# Patient Record
Sex: Female | Born: 1950 | Race: White | Hispanic: No | Marital: Single | Smoking: Current every day smoker
Health system: Southern US, Community
[De-identification: ages and names within clinical notes are randomized; demographics above are authoritative.]

## PROBLEM LIST (undated history)

## (undated) DIAGNOSIS — E119 Type 2 diabetes mellitus without complications: Secondary | ICD-10-CM

## (undated) DIAGNOSIS — I10 Essential (primary) hypertension: Secondary | ICD-10-CM

---

## 2019-12-09 ENCOUNTER — Inpatient Hospital Stay: Payer: Medicare HMO | Admitting: Anesthesiology

## 2019-12-09 ENCOUNTER — Inpatient Hospital Stay: Payer: Medicare HMO

## 2019-12-09 ENCOUNTER — Encounter
Admission: EM | Disposition: A | Discharge status: Skilled Nursing Facility | Payer: Self-pay | Source: Home / Self Care | Attending: Internal Medicine

## 2019-12-09 ENCOUNTER — Emergency Department: Payer: Medicare HMO

## 2019-12-09 ENCOUNTER — Inpatient Hospital Stay
Admission: EM | Admit: 2019-12-09 | Discharge: 2019-12-14 | DRG: 481 | Disposition: A | Discharge status: Skilled Nursing Facility | Payer: Medicare HMO | Attending: Internal Medicine | Admitting: Internal Medicine

## 2019-12-09 ENCOUNTER — Other Ambulatory Visit: Payer: Self-pay

## 2019-12-09 DIAGNOSIS — W109XXA Fall (on) (from) unspecified stairs and steps, initial encounter: Secondary | ICD-10-CM | POA: Diagnosis present

## 2019-12-09 DIAGNOSIS — S72142K Displaced intertrochanteric fracture of left femur, subsequent encounter for closed fracture with nonunion: Secondary | ICD-10-CM

## 2019-12-09 DIAGNOSIS — S72142A Displaced intertrochanteric fracture of left femur, initial encounter for closed fracture: Secondary | ICD-10-CM | POA: Diagnosis present

## 2019-12-09 DIAGNOSIS — I10 Essential (primary) hypertension: Secondary | ICD-10-CM | POA: Diagnosis present

## 2019-12-09 DIAGNOSIS — F329 Major depressive disorder, single episode, unspecified: Secondary | ICD-10-CM | POA: Diagnosis present

## 2019-12-09 DIAGNOSIS — Z79890 Hormone replacement therapy (postmenopausal): Secondary | ICD-10-CM | POA: Diagnosis not present

## 2019-12-09 DIAGNOSIS — Z419 Encounter for procedure for purposes other than remedying health state, unspecified: Secondary | ICD-10-CM

## 2019-12-09 DIAGNOSIS — Z6829 Body mass index (BMI) 29.0-29.9, adult: Secondary | ICD-10-CM

## 2019-12-09 DIAGNOSIS — F17210 Nicotine dependence, cigarettes, uncomplicated: Secondary | ICD-10-CM | POA: Diagnosis present

## 2019-12-09 DIAGNOSIS — Z20822 Contact with and (suspected) exposure to covid-19: Secondary | ICD-10-CM | POA: Diagnosis present

## 2019-12-09 DIAGNOSIS — S82841A Displaced bimalleolar fracture of right lower leg, initial encounter for closed fracture: Secondary | ICD-10-CM | POA: Diagnosis present

## 2019-12-09 DIAGNOSIS — M25551 Pain in right hip: Secondary | ICD-10-CM | POA: Diagnosis present

## 2019-12-09 DIAGNOSIS — W19XXXS Unspecified fall, sequela: Secondary | ICD-10-CM | POA: Diagnosis not present

## 2019-12-09 DIAGNOSIS — Y92008 Other place in unspecified non-institutional (private) residence as the place of occurrence of the external cause: Secondary | ICD-10-CM

## 2019-12-09 DIAGNOSIS — E039 Hypothyroidism, unspecified: Secondary | ICD-10-CM | POA: Diagnosis present

## 2019-12-09 DIAGNOSIS — F17200 Nicotine dependence, unspecified, uncomplicated: Secondary | ICD-10-CM | POA: Diagnosis present

## 2019-12-09 DIAGNOSIS — E119 Type 2 diabetes mellitus without complications: Secondary | ICD-10-CM

## 2019-12-09 DIAGNOSIS — E669 Obesity, unspecified: Secondary | ICD-10-CM | POA: Diagnosis present

## 2019-12-09 DIAGNOSIS — S82891A Other fracture of right lower leg, initial encounter for closed fracture: Secondary | ICD-10-CM | POA: Diagnosis not present

## 2019-12-09 DIAGNOSIS — D62 Acute posthemorrhagic anemia: Secondary | ICD-10-CM | POA: Diagnosis not present

## 2019-12-09 DIAGNOSIS — T148XXA Other injury of unspecified body region, initial encounter: Secondary | ICD-10-CM

## 2019-12-09 DIAGNOSIS — Z7901 Long term (current) use of anticoagulants: Secondary | ICD-10-CM

## 2019-12-09 DIAGNOSIS — R52 Pain, unspecified: Secondary | ICD-10-CM

## 2019-12-09 DIAGNOSIS — W19XXXA Unspecified fall, initial encounter: Secondary | ICD-10-CM | POA: Diagnosis present

## 2019-12-09 DIAGNOSIS — E871 Hypo-osmolality and hyponatremia: Secondary | ICD-10-CM | POA: Diagnosis present

## 2019-12-09 HISTORY — DX: Essential (primary) hypertension: I10

## 2019-12-09 HISTORY — DX: Type 2 diabetes mellitus without complications: E11.9

## 2019-12-09 HISTORY — PX: INTRAMEDULLARY (IM) NAIL INTERTROCHANTERIC: SHX5875

## 2019-12-09 LAB — TYPE AND SCREEN
ABO/RH(D): O POS
Antibody Screen: NEGATIVE

## 2019-12-09 LAB — CBC WITH DIFFERENTIAL/PLATELET
Abs Immature Granulocytes: 0.03 10*3/uL (ref 0.00–0.07)
Basophils Absolute: 0 10*3/uL (ref 0.0–0.1)
Basophils Relative: 0 %
Eosinophils Absolute: 0.1 10*3/uL (ref 0.0–0.5)
Eosinophils Relative: 1 %
HCT: 41.5 % (ref 36.0–46.0)
Hemoglobin: 13.8 g/dL (ref 12.0–15.0)
Immature Granulocytes: 0 %
Lymphocytes Relative: 11 %
Lymphs Abs: 0.7 10*3/uL (ref 0.7–4.0)
MCH: 29.3 pg (ref 26.0–34.0)
MCHC: 33.3 g/dL (ref 30.0–36.0)
MCV: 88.1 fL (ref 80.0–100.0)
Monocytes Absolute: 0.3 10*3/uL (ref 0.1–1.0)
Monocytes Relative: 5 %
Neutro Abs: 5.6 10*3/uL (ref 1.7–7.7)
Neutrophils Relative %: 83 %
Platelets: 142 10*3/uL — ABNORMAL LOW (ref 150–400)
RBC: 4.71 MIL/uL (ref 3.87–5.11)
RDW: 14.6 % (ref 11.5–15.5)
WBC: 6.8 10*3/uL (ref 4.0–10.5)
nRBC: 0 % (ref 0.0–0.2)

## 2019-12-09 LAB — SARS CORONAVIRUS 2 BY RT PCR (HOSPITAL ORDER, PERFORMED IN ~~LOC~~ HOSPITAL LAB): SARS Coronavirus 2: NEGATIVE

## 2019-12-09 LAB — GLUCOSE, CAPILLARY
Glucose-Capillary: 168 mg/dL — ABNORMAL HIGH (ref 70–99)
Glucose-Capillary: 191 mg/dL — ABNORMAL HIGH (ref 70–99)

## 2019-12-09 LAB — BASIC METABOLIC PANEL
Anion gap: 9 (ref 5–15)
BUN: 7 mg/dL — ABNORMAL LOW (ref 8–23)
CO2: 23 mmol/L (ref 22–32)
Calcium: 9.5 mg/dL (ref 8.9–10.3)
Chloride: 100 mmol/L (ref 98–111)
Creatinine, Ser: 0.95 mg/dL (ref 0.44–1.00)
GFR calc Af Amer: >60 mL/min (ref >60)
GFR calc non Af Amer: >60 mL/min (ref >60)
Glucose, Bld: 169 mg/dL — ABNORMAL HIGH (ref 70–99)
Potassium: 4 mmol/L (ref 3.5–5.1)
Sodium: 132 mmol/L — ABNORMAL LOW (ref 135–145)

## 2019-12-09 LAB — TROPONIN I (HIGH SENSITIVITY): Troponin I (High Sensitivity): 4 ng/L (ref <18)

## 2019-12-09 SURGERY — FIXATION, FRACTURE, INTERTROCHANTERIC, WITH INTRAMEDULLARY ROD
Anesthesia: General | Site: Hip | Laterality: Left

## 2019-12-09 MED ORDER — PROPOFOL 500 MG/50ML IV EMUL
INTRAVENOUS | Status: AC
  Filled 2019-12-09: qty 50

## 2019-12-09 MED ORDER — DOCUSATE SODIUM 100 MG PO CAPS
100.0000 mg | ORAL_CAPSULE | Freq: Two times a day (BID) | ORAL | Status: DC
  Administered 2019-12-10 – 2019-12-14 (×8): 100 mg via ORAL
  Filled 2019-12-09 (×9): qty 1

## 2019-12-09 MED ORDER — OXYCODONE HCL 5 MG PO TABS
5.0000 mg | ORAL_TABLET | ORAL | Status: DC | PRN
  Administered 2019-12-09: 5 mg via ORAL
  Administered 2019-12-10: 10 mg via ORAL
  Administered 2019-12-10: 5 mg via ORAL
  Administered 2019-12-10 – 2019-12-14 (×10): 10 mg via ORAL
  Filled 2019-12-09: qty 1
  Filled 2019-12-09 (×2): qty 2
  Filled 2019-12-09: qty 1
  Filled 2019-12-09 (×10): qty 2

## 2019-12-09 MED ORDER — FENTANYL CITRATE (PF) 100 MCG/2ML IJ SOLN
25.0000 ug | INTRAMUSCULAR | Status: DC | PRN
  Administered 2019-12-09 (×4): 25 ug via INTRAVENOUS

## 2019-12-09 MED ORDER — CEFAZOLIN SODIUM-DEXTROSE 2-4 GM/100ML-% IV SOLN
2.0000 g | Freq: Once | INTRAVENOUS | Status: AC
  Administered 2019-12-09: 2 g via INTRAVENOUS
  Filled 2019-12-09: qty 100

## 2019-12-09 MED ORDER — HYDRALAZINE HCL 20 MG/ML IJ SOLN
5.0000 mg | Freq: Once | INTRAMUSCULAR | Status: AC
  Administered 2019-12-09: 5 mg via INTRAVENOUS

## 2019-12-09 MED ORDER — ONDANSETRON HCL 4 MG/2ML IJ SOLN
4.0000 mg | Freq: Once | INTRAMUSCULAR | Status: AC
  Administered 2019-12-09: 4 mg via INTRAVENOUS
  Filled 2019-12-09: qty 2

## 2019-12-09 MED ORDER — KETOROLAC TROMETHAMINE 15 MG/ML IJ SOLN
7.5000 mg | Freq: Four times a day (QID) | INTRAMUSCULAR | Status: AC
  Administered 2019-12-09 – 2019-12-10 (×4): 7.5 mg via INTRAVENOUS
  Filled 2019-12-09 (×3): qty 1

## 2019-12-09 MED ORDER — SODIUM CHLORIDE 0.45 % IV SOLN: INTRAVENOUS | Status: DC

## 2019-12-09 MED ORDER — HYDROCODONE-ACETAMINOPHEN 5-325 MG PO TABS
1.0000 | ORAL_TABLET | Freq: Four times a day (QID) | ORAL | Status: DC | PRN
  Administered 2019-12-09: 2 via ORAL
  Filled 2019-12-09: qty 2

## 2019-12-09 MED ORDER — HYDROMORPHONE HCL 1 MG/ML IJ SOLN
INTRAMUSCULAR | Status: AC
  Filled 2019-12-09: qty 1

## 2019-12-09 MED ORDER — MIDAZOLAM HCL 2 MG/2ML IJ SOLN
INTRAMUSCULAR | Status: AC
  Filled 2019-12-09: qty 2

## 2019-12-09 MED ORDER — FENTANYL CITRATE (PF) 100 MCG/2ML IJ SOLN
INTRAMUSCULAR | Status: AC
  Filled 2019-12-09: qty 2

## 2019-12-09 MED ORDER — MAGNESIUM HYDROXIDE 400 MG/5ML PO SUSP: 30.0000 mL | Freq: Every day | ORAL | Status: DC | PRN

## 2019-12-09 MED ORDER — FLEET ENEMA 7-19 GM/118ML RE ENEM: 1.0000 | ENEMA | Freq: Once | RECTAL | Status: DC | PRN

## 2019-12-09 MED ORDER — KETAMINE HCL 50 MG/ML IJ SOLN
INTRAMUSCULAR | Status: DC | PRN
  Administered 2019-12-09: 50 mg via INTRAVENOUS
  Administered 2019-12-09: 25 mg via INTRAVENOUS

## 2019-12-09 MED ORDER — ACETAMINOPHEN 500 MG PO TABS
1000.0000 mg | ORAL_TABLET | Freq: Four times a day (QID) | ORAL | Status: AC
  Administered 2019-12-09 – 2019-12-10 (×4): 1000 mg via ORAL
  Filled 2019-12-09 (×4): qty 2

## 2019-12-09 MED ORDER — TRAMADOL HCL 50 MG PO TABS
50.0000 mg | ORAL_TABLET | Freq: Four times a day (QID) | ORAL | Status: DC | PRN
  Administered 2019-12-11 – 2019-12-12 (×2): 50 mg via ORAL
  Filled 2019-12-09 (×2): qty 1

## 2019-12-09 MED ORDER — PROPOFOL 500 MG/50ML IV EMUL
INTRAVENOUS | Status: DC | PRN
  Administered 2019-12-09: 100 ug/kg/min via INTRAVENOUS

## 2019-12-09 MED ORDER — METOCLOPRAMIDE HCL 10 MG PO TABS
5.0000 mg | ORAL_TABLET | Freq: Three times a day (TID) | ORAL | Status: DC | PRN
  Administered 2019-12-10: 10 mg via ORAL
  Filled 2019-12-09: qty 1

## 2019-12-09 MED ORDER — METHOCARBAMOL 1000 MG/10ML IJ SOLN
500.0000 mg | Freq: Four times a day (QID) | INTRAVENOUS | Status: DC | PRN
  Filled 2019-12-09: qty 5

## 2019-12-09 MED ORDER — DIPHENHYDRAMINE HCL 12.5 MG/5ML PO ELIX: 12.5000 mg | ORAL_SOLUTION | ORAL | Status: DC | PRN

## 2019-12-09 MED ORDER — KETOROLAC TROMETHAMINE 15 MG/ML IJ SOLN
INTRAMUSCULAR | Status: AC
  Filled 2019-12-09: qty 1

## 2019-12-09 MED ORDER — ONDANSETRON HCL 4 MG/2ML IJ SOLN: 4.0000 mg | Freq: Once | INTRAMUSCULAR | Status: DC | PRN

## 2019-12-09 MED ORDER — MORPHINE SULFATE (PF) 2 MG/ML IV SOLN: 2.0000 mg | INTRAVENOUS | Status: DC | PRN

## 2019-12-09 MED ORDER — SODIUM CHLORIDE 0.9 % IV SOLN: INTRAVENOUS | Status: DC | PRN

## 2019-12-09 MED ORDER — HYDROMORPHONE HCL 1 MG/ML IJ SOLN
INTRAMUSCULAR | Status: DC | PRN
  Administered 2019-12-09 (×4): .5 mg via INTRAVENOUS

## 2019-12-09 MED ORDER — HYDRALAZINE HCL 20 MG/ML IJ SOLN
10.0000 mg | Freq: Four times a day (QID) | INTRAMUSCULAR | Status: DC | PRN
  Administered 2019-12-10 – 2019-12-12 (×2): 10 mg via INTRAVENOUS
  Filled 2019-12-09 (×2): qty 1

## 2019-12-09 MED ORDER — ACETAMINOPHEN 325 MG PO TABS
325.0000 mg | ORAL_TABLET | Freq: Four times a day (QID) | ORAL | Status: DC | PRN
  Administered 2019-12-12: 650 mg via ORAL
  Filled 2019-12-09: qty 2

## 2019-12-09 MED ORDER — MIDAZOLAM HCL 2 MG/2ML IJ SOLN
INTRAMUSCULAR | Status: DC | PRN
  Administered 2019-12-09: 2 mg via INTRAVENOUS

## 2019-12-09 MED ORDER — MORPHINE SULFATE (PF) 4 MG/ML IV SOLN
4.0000 mg | INTRAVENOUS | Status: DC | PRN
  Administered 2019-12-09: 4 mg via INTRAVENOUS
  Filled 2019-12-09: qty 1

## 2019-12-09 MED ORDER — ONDANSETRON HCL 4 MG/2ML IJ SOLN
4.0000 mg | Freq: Four times a day (QID) | INTRAMUSCULAR | Status: DC | PRN
  Administered 2019-12-12: 4 mg via INTRAVENOUS
  Filled 2019-12-09: qty 2

## 2019-12-09 MED ORDER — METOCLOPRAMIDE HCL 5 MG/ML IJ SOLN: 5.0000 mg | Freq: Three times a day (TID) | INTRAMUSCULAR | Status: DC | PRN

## 2019-12-09 MED ORDER — BISACODYL 10 MG RE SUPP: 10.0000 mg | Freq: Every day | RECTAL | Status: DC | PRN

## 2019-12-09 MED ORDER — METHOCARBAMOL 500 MG PO TABS
500.0000 mg | ORAL_TABLET | Freq: Four times a day (QID) | ORAL | Status: DC | PRN
  Administered 2019-12-10 – 2019-12-12 (×3): 500 mg via ORAL
  Filled 2019-12-09 (×4): qty 1

## 2019-12-09 MED ORDER — RIVAROXABAN 10 MG PO TABS
10.0000 mg | ORAL_TABLET | Freq: Every day | ORAL | Status: DC
  Filled 2019-12-09: qty 1

## 2019-12-09 MED ORDER — FENTANYL CITRATE (PF) 100 MCG/2ML IJ SOLN
INTRAMUSCULAR | Status: DC | PRN
  Administered 2019-12-09 (×4): 25 ug via INTRAVENOUS

## 2019-12-09 MED ORDER — CEFAZOLIN SODIUM-DEXTROSE 2-4 GM/100ML-% IV SOLN
2.0000 g | Freq: Four times a day (QID) | INTRAVENOUS | Status: AC
  Administered 2019-12-10 (×3): 2 g via INTRAVENOUS
  Filled 2019-12-09 (×5): qty 100

## 2019-12-09 MED ORDER — HYDRALAZINE HCL 20 MG/ML IJ SOLN
INTRAMUSCULAR | Status: AC
  Filled 2019-12-09: qty 1

## 2019-12-09 MED ORDER — BUPIVACAINE-EPINEPHRINE (PF) 0.5% -1:200000 IJ SOLN
INTRAMUSCULAR | Status: DC | PRN
  Administered 2019-12-09: 30 mL via PERINEURAL

## 2019-12-09 MED ORDER — PROPOFOL 10 MG/ML IV BOLUS
INTRAVENOUS | Status: AC
  Filled 2019-12-09: qty 20

## 2019-12-09 MED ORDER — POLYETHYLENE GLYCOL 3350 17 G PO PACK: 17.0000 g | PACK | Freq: Every day | ORAL | Status: DC | PRN

## 2019-12-09 MED ORDER — KETAMINE HCL 50 MG/ML IJ SOLN
INTRAMUSCULAR | Status: AC
  Filled 2019-12-09: qty 10

## 2019-12-09 MED ORDER — LACTATED RINGERS IV SOLN: INTRAVENOUS | Status: DC | PRN

## 2019-12-09 MED ORDER — HYDROMORPHONE HCL 1 MG/ML IJ SOLN
0.5000 mg | INTRAMUSCULAR | Status: AC | PRN
  Administered 2019-12-09 (×4): 0.5 mg via INTRAVENOUS

## 2019-12-09 MED ORDER — ONDANSETRON HCL 4 MG PO TABS
4.0000 mg | ORAL_TABLET | Freq: Four times a day (QID) | ORAL | Status: DC | PRN
  Administered 2019-12-10: 4 mg via ORAL
  Filled 2019-12-09: qty 1

## 2019-12-09 MED ORDER — MORPHINE SULFATE (PF) 2 MG/ML IV SOLN
2.0000 mg | INTRAVENOUS | Status: DC | PRN
  Administered 2019-12-10 – 2019-12-12 (×3): 2 mg via INTRAVENOUS
  Filled 2019-12-09 (×3): qty 1

## 2019-12-09 MED ORDER — INSULIN ASPART 100 UNIT/ML ~~LOC~~ SOLN
0.0000 [IU] | SUBCUTANEOUS | Status: DC
  Administered 2019-12-09: 3 [IU] via SUBCUTANEOUS
  Administered 2019-12-10: 2 [IU] via SUBCUTANEOUS
  Administered 2019-12-10 – 2019-12-11 (×10): 3 [IU] via SUBCUTANEOUS
  Administered 2019-12-11: 8 [IU] via SUBCUTANEOUS
  Administered 2019-12-11 – 2019-12-12 (×2): 3 [IU] via SUBCUTANEOUS
  Administered 2019-12-12: 2 [IU] via SUBCUTANEOUS
  Administered 2019-12-12 (×2): 3 [IU] via SUBCUTANEOUS
  Administered 2019-12-12: 5 [IU] via SUBCUTANEOUS
  Administered 2019-12-12 – 2019-12-13 (×4): 3 [IU] via SUBCUTANEOUS
  Administered 2019-12-14: 2 [IU] via SUBCUTANEOUS
  Administered 2019-12-14: 5 [IU] via SUBCUTANEOUS
  Administered 2019-12-14: 3 [IU] via SUBCUTANEOUS
  Administered 2019-12-14: 2 [IU] via SUBCUTANEOUS
  Filled 2019-12-09 (×27): qty 1

## 2019-12-09 SURGICAL SUPPLY — 46 items
BIT DRILL 4.3MMS DISTAL GRDTED (BIT) ×1 IMPLANT
BNDG COHESIVE 4X5 TAN STRL (GAUZE/BANDAGES/DRESSINGS) ×3 IMPLANT
BNDG COHESIVE 6X5 TAN STRL LF (GAUZE/BANDAGES/DRESSINGS) ×3 IMPLANT
CANISTER SUCT 1200ML W/VALVE (MISCELLANEOUS) ×3 IMPLANT
CHLORAPREP W/TINT 26 (MISCELLANEOUS) ×6 IMPLANT
CORTICAL BONE SCR 5.0MM X 48MM (Screw) ×3 IMPLANT
COVER WAND RF STERILE (DRAPES) ×3 IMPLANT
DRAPE 3/4 80X56 (DRAPES) ×3 IMPLANT
DRAPE C-ARMOR (DRAPES) ×3 IMPLANT
DRILL 4.3MMS DISTAL GRADUATED (BIT) ×3
DRSG OPSITE POSTOP 3X4 (GAUZE/BANDAGES/DRESSINGS) ×3 IMPLANT
DRSG OPSITE POSTOP 4X6 (GAUZE/BANDAGES/DRESSINGS) ×6 IMPLANT
ELECT CAUTERY BLADE 6.4 (BLADE) ×3 IMPLANT
ELECT REM PT RETURN 9FT ADLT (ELECTROSURGICAL) ×3
ELECTRODE REM PT RTRN 9FT ADLT (ELECTROSURGICAL) ×1 IMPLANT
GAUZE SPONGE 4X4 12PLY STRL (GAUZE/BANDAGES/DRESSINGS) ×3 IMPLANT
GLOVE BIO SURGEON STRL SZ8 (GLOVE) ×6 IMPLANT
GLOVE INDICATOR 8.0 STRL GRN (GLOVE) ×3 IMPLANT
GOWN STRL REUS W/ TWL LRG LVL3 (GOWN DISPOSABLE) ×1 IMPLANT
GOWN STRL REUS W/ TWL XL LVL3 (GOWN DISPOSABLE) ×1 IMPLANT
GOWN STRL REUS W/TWL LRG LVL3 (GOWN DISPOSABLE) ×2
GOWN STRL REUS W/TWL XL LVL3 (GOWN DISPOSABLE) ×2
GUIDEPIN 3.2X17.5 THRD DISP (PIN) ×3 IMPLANT
GUIDEWIRE BALL NOSE 100CM (WIRE) ×3 IMPLANT
HIP FRAC NAIL LAG SCR 10.5X100 (Orthopedic Implant) ×2 IMPLANT
MAT ABSORB  FLUID 56X50 GRAY (MISCELLANEOUS) ×2
MAT ABSORB FLUID 56X50 GRAY (MISCELLANEOUS) ×1 IMPLANT
NAIL HIP FRACT LT 130D 11X400 (Nail) ×3 IMPLANT
NEEDLE FILTER BLUNT 18X 1/2SAF (NEEDLE) ×2
NEEDLE FILTER BLUNT 18X1 1/2 (NEEDLE) ×1 IMPLANT
NEEDLE HYPO 22GX1.5 SAFETY (NEEDLE) ×3 IMPLANT
NS IRRIG 500ML POUR BTL (IV SOLUTION) ×3 IMPLANT
PACK HIP COMPR (MISCELLANEOUS) ×3 IMPLANT
PISTON OSS BIG EASY 5X4.5MM (MISCELLANEOUS) IMPLANT
SCREW BONE CORTICAL 5.0X44 (Screw) ×3 IMPLANT
SCREW CANN THRD AFF 10.5X100 (Orthopedic Implant) ×1 IMPLANT
SCREW CORTICL BON 5.0MM X 48MM (Screw) ×1 IMPLANT
STAPLER SKIN PROX 35W (STAPLE) ×3 IMPLANT
STRAP SAFETY 5IN WIDE (MISCELLANEOUS) ×3 IMPLANT
SUT VIC AB 0 CT1 36 (SUTURE) ×3 IMPLANT
SUT VIC AB 1 CT1 36 (SUTURE) ×3 IMPLANT
SUT VIC AB 2-0 CT1 (SUTURE) ×6 IMPLANT
SYR 10ML LL (SYRINGE) ×3 IMPLANT
SYR 30ML LL (SYRINGE) ×3 IMPLANT
TAPE MICROFOAM 4IN (TAPE) ×3 IMPLANT
TRAY FOLEY SLVR 16FR LF STAT (SET/KITS/TRAYS/PACK) IMPLANT

## 2019-12-09 NOTE — Anesthesia Preprocedure Evaluation (Addendum)
Anesthesia Evaluation  Patient identified by MRN, date of birth, ID band Patient awake  Preop documentation limited or incomplete due to emergent nature of procedure.  Airway Mallampati: II   Neck ROM: full    Dental  (+) Poor Dentition   Pulmonary neg pulmonary ROS, Current Smoker,    Pulmonary exam normal        Cardiovascular Exercise Tolerance: Poor hypertension, On Medications Normal cardiovascular exam+ dysrhythmias Atrial Fibrillation  Rhythm:regular Rate:Normal  Sp cardiac oblation. ja   Neuro/Psych negative neurological ROS  negative psych ROS   GI/Hepatic negative GI ROS, Neg liver ROS,   Endo/Other  negative endocrine ROSdiabetes, Well Controlled, Type 1, Insulin Dependent  Renal/GU negative Renal ROS  negative genitourinary   Musculoskeletal   Abdominal   Peds  Hematology negative hematology ROS (+)   Anesthesia Other Findings Past Medical History: No date: Diabetes mellitus without complication (HCC) No date: Hypertension History reviewed. No pertinent surgical history.   Reproductive/Obstetrics negative OB ROS                            Anesthesia Physical Anesthesia Plan  ASA: II and emergent  Anesthesia Plan: Spinal   Post-op Pain Management:    Induction:   PONV Risk Score and Plan: 2  Airway Management Planned:   Additional Equipment:   Intra-op Plan:   Post-operative Plan:   Informed Consent: I have reviewed the patients History and Physical, chart, labs and discussed the procedure including the risks, benefits and alternatives for the proposed anesthesia with the patient or authorized representative who has indicated his/her understanding and acceptance.     Dental Advisory Given  Plan Discussed with: CRNA  Anesthesia Plan Comments:         Anesthesia Quick Evaluation

## 2019-12-09 NOTE — ED Provider Notes (Signed)
[facility] Emergency Department Provider Note     First MD Initiated Contact with Patient 12/09/19 [phone]     (approximate)  I have reviewed the triage vital signs and the nursing notes.   HISTORY  Chief Complaint Hip Pain and Fall    HPI Melanie Hammond is a 69 y.o. female with a stated past medical history of hypertension, diabetes, and mitral valve prolapse who presents after a mechanical fall via EMS complaining of left hip pain.  Patient's left hip is shortened and internally rotated.  Patient states that she was walking up some steps and tried to step back onto her lower step and lost her footing falling onto her left side.  Patient denies any head trauma or loss of consciousness.  Patient denies any numbness/paresthesias distally         Past Medical History:  Diagnosis Date  . Diabetes mellitus without complication (HCC)   . Hypertension     Patient Active Problem List   Diagnosis Date Noted  . Intertrochanteric fracture of left hip (HCC) 12/09/2019  . Diabetes mellitus (HCC) 12/09/2019  . Nicotine dependence 12/09/2019  . Essential hypertension 12/09/2019  . Obesity (BMI 30-39.9) 12/09/2019  . Fall 12/09/2019    History reviewed. No pertinent surgical history.  Prior to Admission medications   Not on File    Allergies Compazine [prochlorperazine] and Thorazine [chlorpromazine]  Family History  Problem Relation Age of Onset  . Hypertension Mother   . Hypertension Father     Social History Social History   Tobacco Use  . Smoking status: Current Every Day Smoker    Packs/day: 0.50    Years: 10.00    Pack years: 5.00  . Smokeless tobacco: Never Used  Substance Use Topics  . Alcohol use: Not on file  . Drug use: Never    Review of Systems Constitutional: No fever/chills Eyes: No visual changes. ENT: No sore throat. Cardiovascular: Denies chest pain. Respiratory: Denies shortness of breath. Gastrointestinal: No  abdominal pain.  No nausea, no vomiting.  No diarrhea. Genitourinary: Negative for dysuria. Musculoskeletal: Endorses left hip pain Skin: Negative for rash. Neurological: Negative for headaches, weakness/numbness/paresthesias in any extremity Psychiatric: Negative for suicidal ideation/homicidal ideation   ____________________________________________   PHYSICAL EXAM:  VITAL SIGNS: ED Triage Vitals  Enc Vitals Group     BP 12/09/19 0934 (!) 191/85     Pulse Rate 12/09/19 0934 66     Resp 12/09/19 0934 14     Temp 12/09/19 0934 98.7 F (37.1 C)     Temp src --      SpO2 12/09/19 0934 95 %     Weight 12/09/19 0935 192 lb (87.1 kg)     Height --      Head Circumference --      Peak Flow --      Pain Score 12/09/19 0934 8     Pain Loc --      Pain Edu? --      Excl. in GC? --    Constitutional: Alert and oriented. Well appearing and in no acute distress. Eyes: Conjunctivae are normal. PERRL. EOMI. Head: Atraumatic. Nose: No congestion/rhinnorhea. Mouth/Throat: Mucous membranes are moist. Neck: No stridor Cardiovascular: Normal rate, regular rhythm. Grossly normal heart sounds.  Good peripheral circulation. Respiratory: Normal respiratory effort.  No retractions. Gastrointestinal: Soft and nontender. No distention. Musculoskeletal: Significant tenderness palpation over left lateral and anterior hip and exquisite pain with any range of motion..  No joint effusions.  Neurologic: Distally neurovascularly intact.  Normal speech and language. No gross focal neurologic deficits are appreciated. Skin:  Skin is warm and dry. No rash noted. Psychiatric: Mood and affect are normal. Speech and behavior are normal.  ____________________________________________   LABS (all labs ordered are listed, but only abnormal results are displayed)  Labs Reviewed  BASIC METABOLIC PANEL - Abnormal; Notable for the following components:      Result Value   Sodium 132 (*)    Glucose, Bld 169 (*)     BUN 7 (*)    All other components within normal limits  CBC WITH DIFFERENTIAL/PLATELET - Abnormal; Notable for the following components:   Platelets 142 (*)    All other components within normal limits  SARS CORONAVIRUS 2 BY RT PCR (HOSPITAL ORDER, PERFORMED IN [facility])  HIV ANTIBODY (ROUTINE TESTING W REFLEX)  HEMOGLOBIN A1C  TYPE AND SCREEN  TROPONIN I (HIGH SENSITIVITY)  TROPONIN I (HIGH SENSITIVITY)   ____________________________________________ ____________________________________________  RADIOLOGY  ED MD interpretation: Three-view x-ray of the left hip shows a comminuted proximal femur fracture with shortening.  Radiology read pending  Official radiology report(s): DG Chest Port 1 View  Result Date: 12/09/2019 CLINICAL DATA:  Fall today with left hip pain. EXAM: PORTABLE CHEST 1 VIEW COMPARISON:  None. FINDINGS: Lordotic technique is demonstrated. Lungs are adequately inflated and otherwise clear. Cardiomediastinal silhouette is normal. Mild diffuse decreased bone mineralization. Surgical clips over the right axilla. IMPRESSION: No acute cardiopulmonary disease. Electronically Signed   By: Elberta Fortis M.D.   On: 12/09/2019 11:00   DG Hip Unilat With Pelvis 2-3 Views Left  Result Date: 12/09/2019 CLINICAL DATA:  Fall today with left hip pain. EXAM: DG HIP (WITH OR WITHOUT PELVIS) 2-3V LEFT COMPARISON:  None. FINDINGS: Exam demonstrates a moderately displaced intertrochanteric fracture of the left hip. Minimal symmetric degenerative changes of the hips. Mild degenerate change of the spine. Mild diffuse osteopenia. IMPRESSION: Moderately displaced intertrochanteric fracture of the left hip. Electronically Signed   By: Elberta Fortis M.D.   On: 12/09/2019 10:59    ____________________________________________   PROCEDURES  Procedure(s) performed (including Critical Care):  Procedures   ____________________________________________   INITIAL IMPRESSION  / ASSESSMENT AND PLAN / ED COURSE        Patient is a 69 year old female that presents for left hip pain Workup: XR hip Findings: Left proximal femur fracture without dislocation Consult: Orthopedic Surgery (query fascia iliacus block vs continued opiate pain control), hospitalist  Patient does not currently demonstrate complications of fracture such as compartment syndrome, arterial or nerve injury.  Interventions: analgesia Disposition: Admit      ____________________________________________   FINAL CLINICAL IMPRESSION(S) / ED DIAGNOSES  Final diagnoses:  Closed displaced intertrochanteric fracture of left femur, initial encounter [facility])     ED Discharge Orders    None       Note:  This document was prepared using Dragon voice recognition software and may include unintentional dictation errors.   Merwyn Katos, MD 12/09/19 1539

## 2019-12-09 NOTE — Consult Note (Addendum)
ANTICOAGULATION CONSULT NOTE  Pharmacy Consult for Xarelto Indication: VTE prophylaxis  Allergies  Allergen Reactions  . Compazine [Prochlorperazine]   . Thorazine [Chlorpromazine]     Patient Measurements: Weight: 87.1 kg (192 lb)   Vital Signs: Temp: 98.6 F (37 C) (09/18 1744) Temp Source: Oral (09/18 1744) BP: 130/63 (09/18 1744) Pulse Rate: 85 (09/18 1744)  Labs: Recent Labs    12/09/19 1115  HGB 13.8  HCT 41.5  PLT 142*  CREATININE 0.95  TROPONINIHS 4    CrCl cannot be calculated (Unknown ideal weight.).   Medications:  Xarelto - med rec incomplete and pharmacy is unable to reach the patient at this time  Assessment: Pharmacy has consulted for DVT ppx in a patient with a PMH significant for mitral valve prolapse, HTN, and diabetes. Patient is admitted for Intertrochanteric fracture of left hip. Per chart review, patient was previously on Xarelto, but pharmacy is unable to confirm dose (with multiple attempts to reach the patient). Per consult from Dr. Joice Lofts, consult for DVT ppx and to resume Xarelto tomorrow morning - dose unknown to him as well.   Plan:   D/w Dr. Joice Lofts to start Xarelto tomorrow and will do Xarelto 10 mg daily for now until home dose is updated. Will follow Scr/CBC every 3 days.   Pharmacy will continue to obtain updated Xarelto dose.   Katha Cabal 12/09/2019,5:53 PM

## 2019-12-09 NOTE — Anesthesia Postprocedure Evaluation (Signed)
Anesthesia Post Note  Patient: Melanie Hammond  Procedure(s) Performed: INTRAMEDULLARY (IM) NAIL INTERTROCHANTRIC (Left Hip)  Patient location during evaluation: PACU Anesthesia Type: General Level of consciousness: awake and alert Pain management: pain level controlled (Pt w chronic pain, 6/10, currently dozing off) Vital Signs Assessment: post-procedure vital signs reviewed and stable Respiratory status: spontaneous breathing, nonlabored ventilation and respiratory function stable Cardiovascular status: blood pressure returned to baseline and stable Postop Assessment: no apparent nausea or vomiting Anesthetic complications: no   No complications documented.   Last Vitals:  Vitals:   12/09/19 1744 12/09/19 1817  BP: 130/63 (!) 148/54  Pulse: 85 79  Resp: 18 17  Temp: 37 C 37 C  SpO2: 96% 95%    Last Pain:  Vitals:   12/09/19 1748  TempSrc:   PainSc: 8                  Vonceil Upshur Garry Heater

## 2019-12-09 NOTE — ED Triage Notes (Signed)
Pt presents via EMS s/p mechanical fall. C/o left hip pain. Denies LOC.

## 2019-12-09 NOTE — Op Note (Signed)
12/09/2019  3:47 PM  Patient:   Melanie Hammond  Pre-Op Diagnosis:   Closed displaced comminuted intertrochanteric fracture, left hip.  Post-Op Diagnosis:   Same  Procedure:   Reduction and internal fixation of displaced comminuted intertrochanteric left hip fracture with Biomet Affixis TFN nail.  Surgeon:   Maryagnes Amos, MD  Assistant:   None  Anesthesia:   General anesthesia  Findings:   As above  Complications:   None  EBL:   100 cc  Fluids:   1200 cc crystalloid  UOP:   None  TT:   None  Drains:   None  Closure:   Staples  Implants:   Biomet Affixis 11 x 400 mm TFN with a 100 mm lag screw and 44 mm and 48 mm distal interlocking screws  Brief Clinical Note:   The patient is a 69 year old female who sustained above-noted injury earlier today when she tripped and fell on some steps, injuring her left hip. She was brought to the emergency room where x-rays demonstrated the above-noted injury. The patient has been cleared medically and presents at this time for reduction and internal fixation of the displaced intertrochanteric left hip fracture.  Procedure:   The patient was brought into the operating room. After adequate general anesthesia was obtained, the patient was lain in the supine position on the fracture table. The uninjured leg was placed in a flexed and abducted position while the injured lower extremity was placed in longitudinal traction. The fracture was reduced using longitudinal traction and internal rotation. The adequacy of reduction was verified fluoroscopically in AP and lateral projections and found to be satisfactory. The lateral aspects of the left hip and thigh were prepped with ChloraPrep solution before being draped sterilely. Preoperative antibiotics were administered. A timeout was performed to verify the appropriate surgical site.   The greater trochanter was identified fluoroscopically and an approximately 3 cm incision made about 2-3 fingerbreadths  above the tip of the greater trochanter. The incision was carried down through the subcutaneous tissues to expose the gluteal fascia. This was split the length of the incision, providing access to the tip of the trochanter. Under fluoroscopic guidance, a guidewire was drilled through the tip of the trochanter into the proximal metaphysis to the level of the lesser trochanter. After verifying its position fluoroscopically in AP and lateral projections, it was overreamed with the initial reamer to the depth of the lesser trochanter. A guidewire was passed down through the femoral canal to the supracondylar region. The adequacy of guidewire position was verified fluoroscopically in AP and lateral projections before the length of the guidewire within the canal was measured and found to be 430 mm. Therefore, a 400 mm length nail was selected. The guidewire was overreamed sequentially using the flexible reamers, beginning with a 10.5 mm reamer and progressing to a 12.5 mm reamer. This provided good cortical chatter. The 11 x 400 mm Biomet Affixis TFN rod was selected and advanced to the appropriate depth, as verified fluoroscopically.   The guide system for the lag screw was positioned and advanced through an approximately 2 cm stab incision over the lateral aspect of the proximal femur. The guidewire was drilled up through the trochanteric femoral nail and into the femoral neck to rest within 5 mm of subchondral bone. After verifying its position in the femoral neck and head in both AP and lateral projections, the guidewire was measured and found to be optimally replicated by a 100 mm lag screw. The guidewire  was overreamed to the appropriate depth before the lag screw was inserted and advanced to the appropriate depth as verified fluoroscopically in AP and lateral projections. The locking screw was advanced, then backed off a quarter turn to set the lag screw. Again the adequacy of hardware position and fracture  reduction was verified fluoroscopically in AP and lateral projections and found to be excellent.  Attention was directed distally. Using the "perfect circle" technique, the leg and fluoroscopy machine were positioned appropriately. An approximately 1.5 cm stab incision was made over the skin at the appropriate point before the drill bit was advanced through the cortex and across the static hole of the nail. The appropriate length of the screw was determined before the 44 mm distal interlocking screw was positioned, then advanced and tightened securely. Given that the fracture had a subtrochanteric extension, it was felt best to place a second distal interlocking screw. Again using the "perfect circle" technique, a second 1.5 cm stab incision was made over the slotted hole. The drill bit was advanced through the cortex and across the nail to penetrate the medial cortex. The appropriate length of screw was determined before the 48 mm distal interlocking screw was positioned, then advanced and tightened securely. Again the adequacy of screw position was verified fluoroscopically in AP and lateral projections and found to be excellent.  The wounds were irrigated thoroughly with sterile saline solution before the abductor fascia was reapproximated using #1 Vicryl interrupted sutures. The subcutaneous tissues were closed using 2-0 Vicryl interrupted sutures. The skin was closed using staples. A total of 30 cc of 0.5% Sensorcaine with epinephrine was injected in and around all incisions. Sterile occlusive dressings were applied to all wounds before the patient was awakened and transferred back to her hospital bed. The patient was then transferred to the recovery room in satisfactory condition after tolerating the procedure well.

## 2019-12-09 NOTE — H&P (Signed)
History and Physical    Melanie Hammond ZOX:[id number] DOB: Oct 13, 1950 DOA: 12/09/2019  PCP: Melanie Hammond, No Pcp Per   Melanie Hammond coming from: Home  I have personally briefly reviewed Melanie Hammond's old medical records in [facility]  Chief Complaint: Left hip pain   HPI: Melanie Hammond is a 69 y.o. female with medical history significant for diabetes mellitus, hypertension, nicotine dependence and history of mitral valve prolapse who was brought into the ER by EMS after she fell landing on her left side.  Melanie Hammond was unable to get up after her fall due to severe pain.  Melanie Hammond stated that she fell while walking up some stairs landing on her left side.  Left leg appears to be shortened and internally rotated.  She denies any head trauma loss of consciousness.  She denies feeling dizzy or lightheaded prior to the fall. She complains of shortness of breath but denies having any chest pain, no nausea, no vomiting, no diaphoresis or palpitations. She denies having any abdominal pain or any changes in her bowel habits. Labs show sodium of 132, potassium of 4, chloride 100, bicarb 23, BUN 7, creatinine 0.95, white count 6.8, hemoglobin 13.8, hematocrit 41.5, MCV 88.1, RDW 14.6, platelet count 142 Chest x-ray reviewed by me shows no obvious infiltrate or effusion X-ray of the left hip shows moderately displaced intertrochanteric fracture of the left hip Twelve-lead EKG reviewed by me shows sinus rhythm with ST depression in the lateral leads    ED Course: Melanie Hammond is a 69 year old female with a history of diabetes mellitus, hypertension, nicotine dependence and history of mitral valve prolapse who presents to the ER after a mechanical fall at home.  She has a left intertrochanteric femur fracture will be admitted to the hospital for further evaluation.  Review of Systems: As per HPI otherwise 10 point review of systems negative.   Past Medical History:  Diagnosis Date   Diabetes mellitus without complication  (HCC)    Hypertension     History reviewed. No pertinent surgical history.   reports that she has been smoking. She has a 5.00 pack-year smoking history. She has never used smokeless tobacco. She reports that she does not use drugs. No history on file for alcohol use.  Allergies  Allergen Reactions   Compazine [Prochlorperazine]    Thorazine [Chlorpromazine]     Family History  Problem Relation Age of Onset   Hypertension Mother    Hypertension Father      Prior to Admission medications   Not on File    Physical Exam: Vitals:   12/09/19 0934 12/09/19 0935  BP: (!) 191/85   Pulse: 66   Resp: 14   Temp: 98.7 F (37.1 C)   SpO2: 95%   Weight:  87.1 kg     Vitals:   12/09/19 0934 12/09/19 0935  BP: (!) 191/85   Pulse: 66   Resp: 14   Temp: 98.7 F (37.1 C)   SpO2: 95%   Weight:  87.1 kg    Constitutional: NAD, alert and oriented x 3.  Appears uncomfortable Eyes: PERRL, lids and conjunctivae normal ENMT: Mucous membranes are moist.  Neck: normal, supple, no masses, no thyromegaly Respiratory: clear to auscultation bilaterally, no wheezing, no crackles. Normal respiratory effort. No accessory muscle use.  Cardiovascular: Regular rate and rhythm, no murmurs / rubs / gallops. No extremity edema. 2+ pedal pulses. No carotid bruits.  Abdomen: no tenderness, no masses palpated. No hepatosplenomegaly. Bowel sounds positive.  Central adiposity Musculoskeletal: no clubbing /  cyanosis.  Decreased range of motion left hip joint, shortened left lower extremity  Skin: no rashes, lesions, ulcers.  Neurologic: No gross focal neurologic deficit. Psychiatric: Normal mood and affect.   Labs on Admission: I have personally reviewed following labs and imaging studies  CBC: Recent Labs  Lab 12/09/19 1115  WBC 6.8  NEUTROABS 5.6  HGB 13.8  HCT 41.5  MCV 88.1  PLT 142*   Basic Metabolic Panel: Recent Labs  Lab 12/09/19 1115  NA 132*  K 4.0  CL 100  CO2 23   GLUCOSE 169*  BUN 7*  CREATININE 0.95  CALCIUM 9.5   GFR: CrCl cannot be calculated (Unknown ideal weight.). Liver Function Tests: No results for input(s): AST, ALT, ALKPHOS, BILITOT, PROT, ALBUMIN in the last 168 hours. No results for input(s): LIPASE, AMYLASE in the last 168 hours. No results for input(s): AMMONIA in the last 168 hours. Coagulation Profile: No results for input(s): INR, PROTIME in the last 168 hours. Cardiac Enzymes: No results for input(s): CKTOTAL, CKMB, CKMBINDEX, TROPONINI in the last 168 hours. BNP (last 3 results) No results for input(s): PROBNP in the last 8760 hours. HbA1C: No results for input(s): HGBA1C in the last 72 hours. CBG: No results for input(s): GLUCAP in the last 168 hours. Lipid Profile: No results for input(s): CHOL, HDL, LDLCALC, TRIG, CHOLHDL, LDLDIRECT in the last 72 hours. Thyroid Function Tests: No results for input(s): TSH, T4TOTAL, FREET4, T3FREE, THYROIDAB in the last 72 hours. Anemia Panel: No results for input(s): VITAMINB12, FOLATE, FERRITIN, TIBC, IRON, RETICCTPCT in the last 72 hours. Urine analysis: No results found for: COLORURINE, APPEARANCEUR, LABSPEC, PHURINE, GLUCOSEU, HGBUR, BILIRUBINUR, KETONESUR, PROTEINUR, UROBILINOGEN, NITRITE, LEUKOCYTESUR  Radiological Exams on Admission: DG Chest Port 1 View  Result Date: 12/09/2019 CLINICAL DATA:  Fall today with left hip pain. EXAM: PORTABLE CHEST 1 VIEW COMPARISON:  None. FINDINGS: Lordotic technique is demonstrated. Lungs are adequately inflated and otherwise clear. Cardiomediastinal silhouette is normal. Mild diffuse decreased bone mineralization. Surgical clips over the right axilla. IMPRESSION: No acute cardiopulmonary disease. Electronically Signed   By: Elberta Fortis M.D.   On: 12/09/2019 11:00   DG Hip Unilat With Pelvis 2-3 Views Left  Result Date: 12/09/2019 CLINICAL DATA:  Fall today with left hip pain. EXAM: DG HIP (WITH OR WITHOUT PELVIS) 2-3V LEFT COMPARISON:   None. FINDINGS: Exam demonstrates a moderately displaced intertrochanteric fracture of the left hip. Minimal symmetric degenerative changes of the hips. Mild degenerate change of the spine. Mild diffuse osteopenia. IMPRESSION: Moderately displaced intertrochanteric fracture of the left hip. Electronically Signed   By: Elberta Fortis M.D.   On: 12/09/2019 10:59    EKG: Independently reviewed.  Sinus rhythm ST depression lateral leads  Assessment/Plan Principal Problem:   Intertrochanteric fracture of left hip (HCC) Active Problems:   Diabetes mellitus (HCC)   Nicotine dependence   Essential hypertension   Obesity (BMI 30-39.9)   Fall     Intertrochanteric fracture of left hip Status post mechanical fall Left hip x-ray shows moderately displaced intertrochanteric fracture of the left hip. Immobilize left lower extremity Pain control and muscle relaxants Consult orthopedic surgery   Nicotine dependence Melanie Hammond smokes 1/2 pack of cigarettes daily Smoking cessation has been discussed with her in detail We will place Melanie Hammond on a nicotine transdermal patch 14 mg daily   Hypertension Blood pressure is uncontrolled Most likely secondary to pain from left hip fracture Optimize pain control Place Melanie Hammond on hydralazine 10 mg IV for systolic blood pressure  greater than   Diabetes mellitus Melanie Hammond is n.p.o. for planned procedure Will place on Accu-Cheks every 4 hours    Obesity Complicates overall prognosis and care        DVT prophylaxis: SCD Code Status: Full code Family Communication: Greater than 50% of time was spent discussing all of care with Melanie Hammond at the.  She verbalizes understanding and agrees with the plan. Disposition Plan: Back to previous home environment Consults called: Orthopedic surgery    Justeen Hehr MD [facility]     12/09/2019, 12:32 PM

## 2019-12-09 NOTE — Consult Note (Signed)
ORTHOPAEDIC CONSULTATION  REQUESTING PHYSICIAN: Lucile Shutters, MD  Chief Complaint:   Left hip pain.  History of Present Illness: Melanie Hammond is a 69 y.o. female with a history of hypertension and diabetes who lives independently was in her usual state of health this morning when she apparently tripped and fell down several steps while climbing these steps. She was brought to the emergency room where x-rays demonstrated a displaced intertrochanteric fracture of the left hip. The patient denies any associated injuries. She did not strike her head or lose consciousness. She also denies any lightheadedness, dizziness, chest pain, shortness of breath, or other symptoms which may have precipitated her fall.  Past Medical History:  Diagnosis Date  . Diabetes mellitus without complication (HCC)   . Hypertension    History reviewed. No pertinent surgical history. Social History   Socioeconomic History  . Marital status: Single    Spouse name: Not on file  . Number of children: Not on file  . Years of education: Not on file  . Highest education level: Not on file  Occupational History  . Not on file  Tobacco Use  . Smoking status: Current Every Day Smoker    Packs/day: 0.50    Years: 10.00    Pack years: 5.00  . Smokeless tobacco: Never Used  Substance and Sexual Activity  . Alcohol use: Not on file  . Drug use: Never  . Sexual activity: Not on file  Other Topics Concern  . Not on file  Social History Narrative  . Not on file   Social Determinants of Health   Financial Resource Strain:   . Difficulty of Paying Living Expenses: Not on file  Food Insecurity:   . Worried About Programme researcher, broadcasting/film/video in the Last Year: Not on file  . Ran Out of Food in the Last Year: Not on file  Transportation Needs:   . Lack of Transportation (Medical): Not on file  . Lack of Transportation (Non-Medical): Not on file  Physical  Activity:   . Days of Exercise per Week: Not on file  . Minutes of Exercise per Session: Not on file  Stress:   . Feeling of Stress : Not on file  Social Connections:   . Frequency of Communication with Friends and Family: Not on file  . Frequency of Social Gatherings with Friends and Family: Not on file  . Attends Religious Services: Not on file  . Active Member of Clubs or Organizations: Not on file  . Attends Banker Meetings: Not on file  . Marital Status: Not on file   Family History  Problem Relation Age of Onset  . Hypertension Mother   . Hypertension Father    Allergies  Allergen Reactions  . Compazine [Prochlorperazine]   . Thorazine [Chlorpromazine]    Prior to Admission medications   Not on File   DG Chest Port 1 View  Result Date: 12/09/2019 CLINICAL DATA:  Fall today with left hip pain. EXAM: PORTABLE CHEST 1 VIEW COMPARISON:  None. FINDINGS: Lordotic technique is demonstrated. Lungs are adequately inflated and otherwise clear. Cardiomediastinal silhouette is normal. Mild diffuse decreased bone mineralization. Surgical clips over the right axilla. IMPRESSION: No acute cardiopulmonary disease. Electronically Signed   By: Elberta Fortis M.D.   On: 12/09/2019 11:00   DG Hip Unilat With Pelvis 2-3 Views Left  Result Date: 12/09/2019 CLINICAL DATA:  Fall today with left hip pain. EXAM: DG HIP (WITH OR WITHOUT PELVIS) 2-3V LEFT COMPARISON:  None.  FINDINGS: Exam demonstrates a moderately displaced intertrochanteric fracture of the left hip. Minimal symmetric degenerative changes of the hips. Mild degenerate change of the spine. Mild diffuse osteopenia. IMPRESSION: Moderately displaced intertrochanteric fracture of the left hip. Electronically Signed   By: Elberta Fortis M.D.   On: 12/09/2019 10:59    Positive ROS: All other systems have been reviewed and were otherwise negative with the exception of those mentioned in the HPI and as above.  Physical  Exam: General:  Alert, no acute distress Psychiatric:  Patient is competent for consent with normal mood and affect   Cardiovascular:  No pedal edema Respiratory:  No wheezing, non-labored breathing GI:  Abdomen is soft and non-tender Skin:  No lesions in the area of chief complaint Neurologic:  Sensation intact distally Lymphatic:  No axillary or cervical lymphadenopathy  Orthopedic Exam:  Orthopedic examination is limited to the left hip and lower extremity.  The left lower extremity somewhat shortened and externally rotated as compared to the right.  Skin inspection around the left hip is unremarkable.  No swelling, erythema, ecchymosis, abrasions, or other skin abnormalities are identified.  She has mild tenderness to palpation over the lateral aspect of the left hip.  She has more severe pain with any attempted active or passive motion of the hip.  She is neurologically intact to the left lower extremity and foot and she is able dorsiflex and plantarflex her toes and ankle.  Sensation is intact to light touch to all distributions.  She has good capillary refill to her left foot.  X-rays:  X-rays of the pelvis and left hip are available for review and have been reviewed by myself.  These films demonstrate a displaced comminuted intertrochanteric fracture of the left hip with subtrochanteric extension.  No significant degenerative changes of the hip joint are noted.  No lytic lesions or other bony abnormalities are identified.  Assessment: Closed displaced intertrochanteric fracture left hip.  Plan: The treatment options, including both surgical and nonsurgical choices, have been discussed in detail with the patient. She would like to proceed with surgical intervention to include an intramedullary nailing of the displaced intertrochanteric fracture of her left hip. The risks (including bleeding, infection, nerve and/or blood vessel injury, persistent or recurrent pain, loosening or failure of  the components, leg length inequality, dislocation, need for further surgery, blood clots, strokes, heart attacks or arrhythmias, pneumonia, etc.) and benefits of the surgical procedure were discussed. The patient states her understanding and agrees to proceed. She agrees to a blood transfusion if necessary. A formal written consent will be obtained by the nursing staff.  Thank you for asking me to participate in the care of this most delightful woman.  I will be happy to follow her with you.   Maryagnes Amos, MD  Beeper #:  [phone]  12/09/2019 1:59 PM

## 2019-12-09 NOTE — ED Notes (Signed)
Transport here to take pt to OR. 

## 2019-12-09 NOTE — Transfer of Care (Signed)
Immediate Anesthesia Transfer of Care Note  Patient: Melanie Hammond  Procedure(s) Performed: INTRAMEDULLARY (IM) NAIL INTERTROCHANTRIC (Left Hip)  Patient Location: PACU  Anesthesia Type:General  Level of Consciousness: sedated  Airway & Oxygen Therapy: Patient Spontanous Breathing and Patient connected to nasal cannula oxygen  Post-op Assessment: Report given to RN and Post -op Vital signs reviewed and stable  Post vital signs: Reviewed and stable  Last Vitals:  Vitals Value Taken Time  BP 175/75 12/09/19 1546  Temp    Pulse 73 12/09/19 1547  Resp 19 12/09/19 1547  SpO2 99 % 12/09/19 1547  Vitals shown include unvalidated device data.  Last Pain:  Vitals:   12/09/19 1339  PainSc: 8          Complications: No complications documented.

## 2019-12-10 ENCOUNTER — Inpatient Hospital Stay: Payer: Medicare HMO

## 2019-12-10 DIAGNOSIS — I10 Essential (primary) hypertension: Secondary | ICD-10-CM

## 2019-12-10 DIAGNOSIS — S82891A Other fracture of right lower leg, initial encounter for closed fracture: Secondary | ICD-10-CM

## 2019-12-10 DIAGNOSIS — W19XXXS Unspecified fall, sequela: Secondary | ICD-10-CM

## 2019-12-10 DIAGNOSIS — E669 Obesity, unspecified: Secondary | ICD-10-CM

## 2019-12-10 LAB — CBC
HCT: 30.8 % — ABNORMAL LOW (ref 36.0–46.0)
Hemoglobin: 10.2 g/dL — ABNORMAL LOW (ref 12.0–15.0)
MCH: 29.5 pg (ref 26.0–34.0)
MCHC: 33.1 g/dL (ref 30.0–36.0)
MCV: 89 fL (ref 80.0–100.0)
Platelets: 158 10*3/uL (ref 150–400)
RBC: 3.46 MIL/uL — ABNORMAL LOW (ref 3.87–5.11)
RDW: 14.7 % (ref 11.5–15.5)
WBC: 6.1 10*3/uL (ref 4.0–10.5)
nRBC: 0 % (ref 0.0–0.2)

## 2019-12-10 LAB — GLUCOSE, CAPILLARY
Glucose-Capillary: 139 mg/dL — ABNORMAL HIGH (ref 70–99)
Glucose-Capillary: 153 mg/dL — ABNORMAL HIGH (ref 70–99)
Glucose-Capillary: 160 mg/dL — ABNORMAL HIGH (ref 70–99)
Glucose-Capillary: 167 mg/dL — ABNORMAL HIGH (ref 70–99)
Glucose-Capillary: 170 mg/dL — ABNORMAL HIGH (ref 70–99)
Glucose-Capillary: 176 mg/dL — ABNORMAL HIGH (ref 70–99)

## 2019-12-10 LAB — BASIC METABOLIC PANEL
Anion gap: 6 (ref 5–15)
BUN: 10 mg/dL (ref 8–23)
CO2: 26 mmol/L (ref 22–32)
Calcium: 8.6 mg/dL — ABNORMAL LOW (ref 8.9–10.3)
Chloride: 98 mmol/L (ref 98–111)
Creatinine, Ser: 1.03 mg/dL — ABNORMAL HIGH (ref 0.44–1.00)
GFR calc Af Amer: >60 mL/min (ref >60)
GFR calc non Af Amer: 56 mL/min — ABNORMAL LOW (ref >60)
Glucose, Bld: 162 mg/dL — ABNORMAL HIGH (ref 70–99)
Potassium: 4.6 mmol/L (ref 3.5–5.1)
Sodium: 130 mmol/L — ABNORMAL LOW (ref 135–145)

## 2019-12-10 MED ORDER — AMLODIPINE BESYLATE 10 MG PO TABS: 10.0000 mg | ORAL_TABLET | Freq: Every day | ORAL | Status: DC

## 2019-12-10 MED ORDER — PROMETHAZINE HCL 25 MG/ML IJ SOLN: 12.5000 mg | Freq: Once | INTRAMUSCULAR | Status: DC

## 2019-12-10 MED ORDER — SODIUM CHLORIDE 0.9 % IV SOLN: INTRAVENOUS | Status: DC

## 2019-12-10 MED ORDER — CEFAZOLIN SODIUM-DEXTROSE 2-4 GM/100ML-% IV SOLN
2.0000 g | Freq: Once | INTRAVENOUS | Status: AC
  Administered 2019-12-11: 2 g via INTRAVENOUS
  Filled 2019-12-10: qty 100

## 2019-12-10 MED ORDER — BUPROPION HCL ER (XL) 150 MG PO TB24
150.0000 mg | ORAL_TABLET | Freq: Every day | ORAL | Status: DC
  Administered 2019-12-10 – 2019-12-14 (×5): 150 mg via ORAL
  Filled 2019-12-10 (×5): qty 1

## 2019-12-10 MED ORDER — DONEPEZIL HCL 5 MG PO TABS
5.0000 mg | ORAL_TABLET | Freq: Every evening | ORAL | Status: DC
  Administered 2019-12-10 – 2019-12-13 (×4): 5 mg via ORAL
  Filled 2019-12-10 (×4): qty 1

## 2019-12-10 MED ORDER — ALBUTEROL SULFATE (2.5 MG/3ML) 0.083% IN NEBU: 3.0000 mL | INHALATION_SOLUTION | Freq: Four times a day (QID) | RESPIRATORY_TRACT | Status: DC | PRN

## 2019-12-10 MED ORDER — BUSPIRONE HCL 10 MG PO TABS
15.0000 mg | ORAL_TABLET | Freq: Two times a day (BID) | ORAL | Status: DC
  Administered 2019-12-10 – 2019-12-14 (×7): 15 mg via ORAL
  Filled 2019-12-10 (×7): qty 2

## 2019-12-10 MED ORDER — VERAPAMIL HCL ER 240 MG PO TBCR
240.0000 mg | EXTENDED_RELEASE_TABLET | Freq: Every day | ORAL | Status: DC
  Administered 2019-12-10 – 2019-12-14 (×4): 240 mg via ORAL
  Filled 2019-12-10 (×5): qty 1

## 2019-12-10 MED ORDER — SERTRALINE HCL 50 MG PO TABS
50.0000 mg | ORAL_TABLET | Freq: Every day | ORAL | Status: DC
  Administered 2019-12-10 – 2019-12-14 (×4): 50 mg via ORAL
  Filled 2019-12-10 (×4): qty 1

## 2019-12-10 MED ORDER — PANTOPRAZOLE SODIUM 40 MG PO TBEC
40.0000 mg | DELAYED_RELEASE_TABLET | Freq: Every day | ORAL | Status: DC
  Administered 2019-12-10 – 2019-12-14 (×4): 40 mg via ORAL
  Filled 2019-12-10 (×4): qty 1

## 2019-12-10 MED ORDER — LATANOPROST 0.005 % OP SOLN
1.0000 [drp] | Freq: Two times a day (BID) | OPHTHALMIC | Status: DC
  Filled 2019-12-10: qty 2.5

## 2019-12-10 MED ORDER — NICOTINE 14 MG/24HR TD PT24
14.0000 mg | MEDICATED_PATCH | Freq: Every day | TRANSDERMAL | Status: DC
  Administered 2019-12-10 – 2019-12-14 (×5): 14 mg via TRANSDERMAL
  Filled 2019-12-10 (×5): qty 1

## 2019-12-10 MED ORDER — DORZOLAMIDE HCL-TIMOLOL MAL PF 22.3-6.8 MG/ML OP SOLN
1.0000 [drp] | Freq: Two times a day (BID) | OPHTHALMIC | Status: DC
  Administered 2019-12-11 – 2019-12-13 (×5): 1 [drp] via OPHTHALMIC
  Filled 2019-12-10 (×10): qty 0.1

## 2019-12-10 MED ORDER — SPIRONOLACTONE 25 MG PO TABS: 25.0000 mg | ORAL_TABLET | Freq: Every day | ORAL | Status: DC

## 2019-12-10 MED ORDER — TRAZODONE HCL 100 MG PO TABS
100.0000 mg | ORAL_TABLET | Freq: Every day | ORAL | Status: DC
  Administered 2019-12-10 – 2019-12-13 (×4): 100 mg via ORAL
  Filled 2019-12-10 (×4): qty 1

## 2019-12-10 MED ORDER — METOPROLOL TARTRATE 50 MG PO TABS: 100.0000 mg | ORAL_TABLET | Freq: Two times a day (BID) | ORAL | Status: DC

## 2019-12-10 MED ORDER — HYDROXYZINE HCL 10 MG PO TABS
10.0000 mg | ORAL_TABLET | Freq: Two times a day (BID) | ORAL | Status: DC | PRN
  Filled 2019-12-10: qty 5

## 2019-12-10 MED ORDER — LATANOPROST 0.005 % OP SOLN
1.0000 [drp] | Freq: Every day | OPHTHALMIC | Status: DC
  Administered 2019-12-10 – 2019-12-13 (×4): 1 [drp] via OPHTHALMIC
  Filled 2019-12-10: qty 2.5

## 2019-12-10 MED ORDER — METOPROLOL TARTRATE 25 MG PO TABS
25.0000 mg | ORAL_TABLET | Freq: Two times a day (BID) | ORAL | Status: DC
  Administered 2019-12-10 – 2019-12-14 (×8): 25 mg via ORAL
  Filled 2019-12-10 (×9): qty 1

## 2019-12-10 MED ORDER — AMITRIPTYLINE HCL 25 MG PO TABS
100.0000 mg | ORAL_TABLET | Freq: Every day | ORAL | Status: DC
  Administered 2019-12-10 – 2019-12-13 (×4): 100 mg via ORAL
  Filled 2019-12-10 (×4): qty 4

## 2019-12-10 MED ORDER — LEVOTHYROXINE SODIUM 50 MCG PO TABS
50.0000 ug | ORAL_TABLET | Freq: Every day | ORAL | Status: DC
  Administered 2019-12-11 – 2019-12-14 (×4): 50 ug via ORAL
  Filled 2019-12-10 (×4): qty 1

## 2019-12-10 NOTE — Progress Notes (Addendum)
PROGRESS NOTE    Melanie Hammond  JJK:[id number] DOB: October 01, 1950 DOA: 12/09/2019 PCP: Patient, No Pcp Per    Brief Narrative:  Melanie Hammond is a 69 y.o. female with medical history significant for diabetes mellitus, hypertension, nicotine dependence and history of mitral valve prolapse who was brought into the ER by EMS after she fell landing on her left side.  Patient was unable to get up after her fall due to severe pain.  Patient stated that she fell while walking up some stairs landing on her left side.  Left leg appears to be shortened and internally rotated.  She denies any head trauma loss of consciousness.  She denies feeling dizzy or lightheaded prior to the fall. She complains of shortness of breath but denies having any chest pain, no nausea, no vomiting, no diaphoresis or palpitations. She denies having any abdominal pain or any changes in her bowel habits. Labs show sodium of 132, potassium of 4, chloride 100, bicarb 23, BUN 7, creatinine 0.95, white count 6.8, hemoglobin 13.8, hematocrit 41.5, MCV 88.1, RDW 14.6, platelet count 142 Chest x-ray reviewed by me shows no obvious infiltrate or effusion X-ray of the left hip shows moderately displaced intertrochanteric fracture of the left hip Twelve-lead EKG reviewed by me shows sinus rhythm with ST depression in the lateral leads    Consultants:   Orthopedics  Procedures: Status post IM nailing of displaced subtrochanteric left hip fracture on 9/18  Antimicrobials:   Cefazolin x3 doses   Subjective: Patient complained this morning she is unable to walk has pain of the right ankle and unable to put weight on it.  No other complaints  Objective: Vitals:   12/09/19 2227 12/09/19 2357 12/10/19 0416 12/10/19 0745  BP: (!) 133/54 (!) 139/54 (!) 142/51 (!) 157/55  Pulse: 79 78 84 79  Resp: 17 18 17 17   Temp: 98.5 F (36.9 C) 99.1 F (37.3 C) 99.3 F (37.4 C) 97.9 F (36.6 C)  TempSrc: Oral Oral Oral   SpO2: 96% 94% 93% 98%   Weight:        Intake/Output Summary (Last 24 hours) at 12/10/2019 1434 Last data filed at 12/10/2019 1007 Gross per 24 hour  Intake 2601.36 ml  Output 600 ml  Net 2001.36 ml   Filed Weights   12/09/19 0935  Weight: 87.1 kg    Examination:  General exam: Appears calm and comfortable  Respiratory system: Clear to auscultation. Respiratory effort normal. Cardiovascular system: S1 & S2 heard, RRR. No JVD, murmurs, rubs, gallops or clicks.  Gastrointestinal system: Abdomen is nondistended, soft and nontender.  Normal bowel sounds heard. Central nervous system: Alert and oriented.  Grossly intact Extremities: Right ankle mildly swollen, left dressing on Skin: Warm dry Psychiatry: Judgement and insight appear normal. Mood & affect appropriate.     Data Reviewed: I have personally reviewed following labs and imaging studies  CBC: Recent Labs  Lab 12/09/19 1115 12/10/19 0347  WBC 6.8 6.1  NEUTROABS 5.6  --   HGB 13.8 10.2*  HCT 41.5 30.8*  MCV 88.1 89.0  PLT 142* 158   Basic Metabolic Panel: Recent Labs  Lab 12/09/19 1115 12/10/19 0347  NA 132* 130*  K 4.0 4.6  CL 100 98  CO2 23 26  GLUCOSE 169* 162*  BUN 7* 10  CREATININE 0.95 1.03*  CALCIUM 9.5 8.6*   GFR: CrCl cannot be calculated (Unknown ideal weight.). Liver Function Tests: No results for input(s): AST, ALT, ALKPHOS, BILITOT, PROT, ALBUMIN in the last  168 hours. No results for input(s): LIPASE, AMYLASE in the last 168 hours. No results for input(s): AMMONIA in the last 168 hours. Coagulation Profile: No results for input(s): INR, PROTIME in the last 168 hours. Cardiac Enzymes: No results for input(s): CKTOTAL, CKMB, CKMBINDEX, TROPONINI in the last 168 hours. BNP (last 3 results) No results for input(s): PROBNP in the last 8760 hours. HbA1C: No results for input(s): HGBA1C in the last 72 hours. CBG: Recent Labs  Lab 12/09/19 1953 12/09/19 2359 12/10/19 0419 12/10/19 0744 12/10/19 1128   GLUCAP 191* 139* 160* 153* 170*   Lipid Profile: No results for input(s): CHOL, HDL, LDLCALC, TRIG, CHOLHDL, LDLDIRECT in the last 72 hours. Thyroid Function Tests: No results for input(s): TSH, T4TOTAL, FREET4, T3FREE, THYROIDAB in the last 72 hours. Anemia Panel: No results for input(s): VITAMINB12, FOLATE, FERRITIN, TIBC, IRON, RETICCTPCT in the last 72 hours. Sepsis Labs: No results for input(s): PROCALCITON, LATICACIDVEN in the last 168 hours.  Recent Results (from the past 240 hour(s))  SARS Coronavirus 2 by RT PCR (hospital order, performed in [facility] lab) Nasopharyngeal Nasopharyngeal Swab     Status: None   Collection Time: 12/09/19 10:34 AM   Specimen: Nasopharyngeal Swab  Result Value Ref Range Status   SARS Coronavirus 2 NEGATIVE NEGATIVE Final    Comment: (NOTE) SARS-CoV-2 target nucleic acids are NOT DETECTED.  The SARS-CoV-2 RNA is generally detectable in upper and lower respiratory specimens during the acute phase of infection. The lowest concentration of SARS-CoV-2 viral copies this assay can detect is 250 copies / mL. A negative result does not preclude SARS-CoV-2 infection and should not be used as the sole basis for treatment or other patient management decisions.  A negative result may occur with improper specimen collection / handling, submission of specimen other than nasopharyngeal swab, presence of viral mutation(s) within the areas targeted by this assay, and inadequate number of viral copies (<250 copies / mL). A negative result must be combined with clinical observations, patient history, and epidemiological information.  Fact Sheet for Patients:   BoilerBrush.com.cyhttps://www.fda.gov/media/136312/download  Fact Sheet for Healthcare Providers: https://pope.com/https://www.fda.gov/media/136313/download  This test is not yet approved or  cleared by the [place] FDA and has been authorized for detection and/or diagnosis of SARS-CoV-2 by FDA under an Emergency Use  Authorization (EUA).  This EUA will remain in effect (meaning this test can be used) for the duration of the COVID-19 declaration under Section 564(b)(1) of the Act, 21 U.S.C. section 360bbb-3(b)(1), unless the authorization is terminated or revoked sooner.  Performed at [facility], [address] [place], [place] [address]          Radiology Studies: DG Ankle Complete Right  Result Date: 12/10/2019 CLINICAL DATA:  Right ankle pain and swelling, fall yesterday EXAM: RIGHT ANKLE - COMPLETE 3+ VIEW COMPARISON:  None. FINDINGS: There is a displaced fracture of the right medial malleolus and a probable nondisplaced fracture of the lateral malleolus evidenced by subtle cortical buckling on mortise view. There is no widening of the ankle mortise. The posterior malleolus is intact. Diffuse soft tissue edema about the ankle. IMPRESSION: There is a displaced fracture of the right medial malleolus and a probable nondisplaced fracture of the lateral malleolus evidenced by subtle cortical buckling on mortise view. There is no widening of the ankle mortise. Electronically Signed   By: Lauralyn PrimesAlex  Bibbey M.D.   On: 12/10/2019 10:57   DG Chest Port 1 View  Result Date: 12/09/2019 CLINICAL DATA:  Fall  today with left hip pain. EXAM: PORTABLE CHEST 1 VIEW COMPARISON:  None. FINDINGS: Lordotic technique is demonstrated. Lungs are adequately inflated and otherwise clear. Cardiomediastinal silhouette is normal. Mild diffuse decreased bone mineralization. Surgical clips over the right axilla. IMPRESSION: No acute cardiopulmonary disease. Electronically Signed   By: Elberta Fortis M.D.   On: 12/09/2019 11:00   DG C-Arm 1-60 Min  Result Date: 12/09/2019 CLINICAL DATA:  The hip fracture EXAM: LEFT FEMUR 2 VIEWS; DG C-ARM 1-60 MIN COMPARISON:  None. FINDINGS: Six intraop views were submitted for review of IM nail fixation the proximal femur fracture. Fluoro time 1 minutes 17 seconds IMPRESSION: Status post  ORIF proximal femur fracture with IM nail fixation Electronically Signed   By: Jonna Clark M.D.   On: 12/09/2019 15:51   DG Hip Unilat With Pelvis 2-3 Views Left  Result Date: 12/09/2019 CLINICAL DATA:  Fall today with left hip pain. EXAM: DG HIP (WITH OR WITHOUT PELVIS) 2-3V LEFT COMPARISON:  None. FINDINGS: Exam demonstrates a moderately displaced intertrochanteric fracture of the left hip. Minimal symmetric degenerative changes of the hips. Mild degenerate change of the spine. Mild diffuse osteopenia. IMPRESSION: Moderately displaced intertrochanteric fracture of the left hip. Electronically Signed   By: Elberta Fortis M.D.   On: 12/09/2019 10:59   DG FEMUR MIN 2 VIEWS LEFT  Result Date: 12/09/2019 CLINICAL DATA:  The hip fracture EXAM: LEFT FEMUR 2 VIEWS; DG C-ARM 1-60 MIN COMPARISON:  None. FINDINGS: Six intraop views were submitted for review of IM nail fixation the proximal femur fracture. Fluoro time 1 minutes 17 seconds IMPRESSION: Status post ORIF proximal femur fracture with IM nail fixation Electronically Signed   By: Jonna Clark M.D.   On: 12/09/2019 15:51        Scheduled Meds: . amitriptyline  100 mg Oral QHS  . buPROPion  150 mg Oral Daily  . busPIRone  15 mg Oral BID  . docusate sodium  100 mg Oral BID  . donepezil  5 mg Oral QPM  . dorzolamidel-timolol  1 drop Left Eye BID  . insulin aspart  0-15 Units Subcutaneous Q4H  . latanoprost  1 drop Left Eye BID  . levothyroxine  50 mcg Oral Daily  . metoprolol tartrate  25 mg Oral BID  . pantoprazole  40 mg Oral Daily  . sertraline  50 mg Oral Daily  . spironolactone  25 mg Oral Daily  . traZODone  100 mg Oral QHS  . verapamil  240 mg Oral Daily   Continuous Infusions: . sodium chloride 75 mL/hr at 12/10/19 0943  . [START ON 12/11/2019]  ceFAZolin (ANCEF) IV    . methocarbamol (ROBAXIN) IV      Assessment & Plan:   Principal Problem:   Intertrochanteric fracture of left hip (HCC) Active Problems:   Diabetes  mellitus (HCC)   Nicotine dependence   Essential hypertension   Obesity (BMI 30-39.9)   Fall   Intertrochanteric fracture of left hip-status post mechanical fall Left hip x-ray revealed moderately displaced intertrochanteric fracture of the left hip Orthopedics was consulted, patient was taken to the OR and now status post IM nailing of displaced subtrochanteric left hip fracture Pain control PT OT after evaluation of right ankle pain Bowel regimen  Right Ankle pain- Pt states from fall having Rt ankle pain, unable to place weight on it. I obtained ankle xr -displaced fracture of the right medial malleolus and a probable nondisplaced fracture of the lateral malleolus.   Plan for  the OR in a.m. for nailing Pain control  Hyponatremia- Na dropped to 130. Dehydration v.s. SIADH from pain, unclear. Will change ivf to NS for gentle hydration and repeat level in am.   Nicotine dependence Patient smokes 1/2 pack of cigarettes daily Smoking cessation has been discussed in detail  Will start nicotine patch today  Essential hypertension Currently uncontrolled.'s her blood pressure medications were reviewed we will start her on verapamil and beta-blocker.   We will start beta-blocker at a lower dose 25 mg twice daily.   Optimize pain   Diabetes mellitus R ISS, hypoglycemic protocol   Obesity Complicates overall prognosis and care   DVT prophylaxis: Xarelto Code Status: Full Family Communication: Discussed with daughter extensively about patient's current status, needing surgery in a.m., also went through some of her medications.  Patient is on a lot of medications and daughter will bring all the bottles tomorrow.  Status is: Inpatient  Remains inpatient appropriate because:Ongoing diagnostic testing needed not appropriate for outpatient work up   Dispo: The patient is from: Home              Anticipated d/c is to: snf v.s. home, likely snf              Anticipated d/c  date is: 3 days              Patient currently is not medically stable to d/c.will need surgery tomorrow.            LOS: 1 day   Time spent:45 min with >50% on coc    Lynn Ito, MD [facility] Pager [phone]  If 7PM-7AM, please contact night-coverage [email or web address] Password [facility] 12/10/2019, 2:34 PM

## 2019-12-10 NOTE — [place] FL2 (Signed)
[place] MEDICAID FL2 LEVEL OF CARE SCREENING TOOL     IDENTIFICATION  Patient Name: Melanie Hammond Birthdate: 1950-08-29 Sex: female Admission Date (Current Location): 12/09/2019  [facility] and [place] Number:  Chiropodist and Address:  [facility], [address] [place], [place] [address]      Provider Number: [id number]  Attending Physician Name and Address:  Lynn Ito, MD  Relative Name and Phone Number:  Joice Lofts [phone]    Current Level of Care: Hospital Recommended Level of Care: Skilled Nursing Facility Prior Approval Number:    Date Approved/Denied:   PASRR Number:    Discharge Plan: SNF    Current Diagnoses: Patient Active Problem List   Diagnosis Date Noted  . Intertrochanteric fracture of left hip (HCC) 12/09/2019  . Diabetes mellitus (HCC) 12/09/2019  . Nicotine dependence 12/09/2019  . Essential hypertension 12/09/2019  . Obesity (BMI 30-39.9) 12/09/2019  . Fall 12/09/2019    Orientation RESPIRATION BLADDER Height & Weight     Self, Time, Situation, Place  Normal Continent Weight: 192 lb (87.1 kg) Height:     BEHAVIORAL SYMPTOMS/MOOD NEUROLOGICAL BOWEL NUTRITION STATUS      Continent Diet  AMBULATORY STATUS COMMUNICATION OF NEEDS Skin   Extensive Assist Verbally Surgical wounds, Normal (left hip)                       Personal Care Assistance Level of Assistance  Bathing, Feeding, Dressing Bathing Assistance: Maximum assistance Feeding assistance: Limited assistance Dressing Assistance: Maximum assistance     Functional Limitations Info  Sight, Hearing, Speech Sight Info: Adequate Hearing Info: Adequate Speech Info: Adequate    SPECIAL CARE FACTORS FREQUENCY  PT (By licensed PT), OT (By licensed OT)     PT Frequency: 5x week OT Frequency: 5x week            Contractures Contractures Info: Not present    Additional Factors Info  Code Status, Allergies Code Status Info: Full Allergies  Info: Thorazine, Compazine           Current Medications (12/10/2019):  This is the current hospital active medication list Current Facility-Administered Medications  Medication Dose Route Frequency Provider Last Rate Last Admin  . 0.9 %  sodium chloride infusion   Intravenous Continuous Lynn Ito, MD 75 mL/hr at 12/10/19 0943 New Bag at 12/10/19 0943  . acetaminophen (TYLENOL) tablet 1,000 mg  1,000 mg Oral Q6H Poggi, Excell Seltzer, MD   1,000 mg at 12/10/19 0519  . acetaminophen (TYLENOL) tablet 325-650 mg  325-650 mg Oral Q6H PRN Poggi, Excell Seltzer, MD      . bisacodyl (DULCOLAX) suppository 10 mg  10 mg Rectal Daily PRN Poggi, Excell Seltzer, MD      . ceFAZolin (ANCEF) IVPB 2g/100 mL premix  2 g Intravenous Q6H Poggi, Excell Seltzer, MD 200 mL/hr at 12/10/19 0528 2 g at 12/10/19 0528  . diphenhydrAMINE (BENADRYL) 12.5 MG/5ML elixir 12.5-25 mg  12.5-25 mg Oral Q4H PRN Poggi, Excell Seltzer, MD      . docusate sodium (COLACE) capsule 100 mg  100 mg Oral BID Poggi, Excell Seltzer, MD   100 mg at 12/10/19 0824  . hydrALAZINE (APRESOLINE) injection 10 mg  10 mg Intravenous Q6H PRN Poggi, Excell Seltzer, MD      . insulin aspart (novoLOG) injection 0-15 Units  0-15 Units Subcutaneous Q4H Poggi, Excell Seltzer, MD   3 Units at 12/10/19 [phone]  . ketorolac (TORADOL) 15 MG/ML injection 7.5  mg  7.5 mg Intravenous Q6H Poggi, Excell Seltzer, MD   7.5 mg at 12/10/19 0520  . magnesium hydroxide (MILK OF MAGNESIA) suspension 30 mL  30 mL Oral Daily PRN Poggi, Excell Seltzer, MD      . methocarbamol (ROBAXIN) tablet 500 mg  500 mg Oral Q6H PRN Poggi, Excell Seltzer, MD       Or  . methocarbamol (ROBAXIN) 500 mg in dextrose 5 % 50 mL IVPB  500 mg Intravenous Q6H PRN Poggi, Excell Seltzer, MD      . metoCLOPramide (REGLAN) tablet 5-10 mg  5-10 mg Oral Q8H PRN Poggi, Excell Seltzer, MD   10 mg at 12/10/19 0015   Or  . metoCLOPramide (REGLAN) injection 5-10 mg  5-10 mg Intravenous Q8H PRN Poggi, Excell Seltzer, MD      . morphine 2 MG/ML injection 2-4 mg  2-4 mg Intravenous Q2H PRN Poggi, Excell Seltzer, MD      .  ondansetron (ZOFRAN) tablet 4 mg  4 mg Oral Q6H PRN Poggi, Excell Seltzer, MD       Or  . ondansetron (ZOFRAN) injection 4 mg  4 mg Intravenous Q6H PRN Poggi, Excell Seltzer, MD      . oxyCODONE (Oxy IR/ROXICODONE) immediate release tablet 5-10 mg  5-10 mg Oral Q4H PRN Poggi, Excell Seltzer, MD   5 mg at 12/10/19 0538  . polyethylene glycol (MIRALAX / GLYCOLAX) packet 17 g  17 g Oral Daily PRN Poggi, Excell Seltzer, MD      . rivaroxaban Carlena Hurl) tablet 10 mg  10 mg Oral Q supper Duncan, Asajah R, RPH      . sodium phosphate (FLEET) 7-19 GM/118ML enema 1 enema  1 enema Rectal Once PRN Poggi, Excell Seltzer, MD      . traMADol Janean Sark) tablet 50 mg  50 mg Oral Q6H PRN Poggi, Excell Seltzer, MD         Discharge Medications: Please see discharge summary for a list of discharge medications.  Relevant Imaging Results:  Relevant Lab Results:   Additional Information SS [id number]  Maud Deed, LCSW

## 2019-12-10 NOTE — Progress Notes (Signed)
Physical Therapy Evaluation Patient Details Name: Melanie Hammond MRN: [id number] DOB: 04-16-1950 Today's Date: 12/10/2019   History of Present Illness  Per MD note:Melanie Hammond is a 69 y.o. female with medical history significant for diabetes mellitus, hypertension, nicotine dependence and history of mitral valve prolapse who was brought into the ER by EMS after she fell landing on her left side.  Patient was unable to get up after her fall due to severe pain.  Patient stated that she fell while walking up some stairs landing on her left side.  Left leg appears to be shortened and internally rotated.  She denies any head trauma loss of consciousness.  She denies feeling dizzy or lightheaded prior to the fall.  Clinical Impression  Patient agrees to PT evaluation. Pt reports no pain during prior to PT eval but increased pain with movement and inability to WB on R ankle. Pt lives with her daughter with 6 steps entry into home. Pt ambulated without AD prior to this hospital admission. Pt has 2/5 strength LLE hip and knee and needs mod assist for bed mobility. She is unable to transfer sit to stand with RW due to R ankle pain and L hip pain.  Pt has WNL static sitting balance. Pt will continue to benefit from skilled PT to improve mobility and strength.    Follow Up Recommendations SNF    Equipment Recommendations  Rolling walker with 5" wheels    Recommendations for Other Services       Precautions / Restrictions Precautions Precautions: Fall Restrictions Weight Bearing Restrictions: Yes      Mobility  Bed Mobility Overal bed mobility: Needs Assistance Bed Mobility: Supine to Sit;Sit to Supine     Supine to sit: Mod assist Sit to supine: Mod assist      Transfers Overall transfer level:  (unable to stand due to R ankle tenderness)                  Ambulation/Gait                Stairs            Wheelchair Mobility    Modified Rankin (Stroke Patients Only)        Balance Overall balance assessment: Independent (sitting balance is WNL)                                           Pertinent Vitals/Pain Pain Assessment: Faces Faces Pain Scale: Hurts whole lot Pain Location:  (left hip, right ankle)    Home Living Family/patient expects to be discharged to:: Private residence Living Arrangements: Children Available Help at Discharge: Family   Home Access: Stairs to enter   Secretary/administrator of Steps: 6 Home Layout: One level        Prior Function Level of Independence: Independent with assistive device(s)               Hand Dominance        Extremity/Trunk Assessment   Upper Extremity Assessment Upper Extremity Assessment: Overall WFL for tasks assessed    Lower Extremity Assessment Lower Extremity Assessment: LLE deficits/detail LLE Deficits / Details: -2/5 L hip       Communication   Communication: No difficulties  Cognition Arousal/Alertness: Awake/alert Behavior During Therapy: WFL for tasks assessed/performed Overall Cognitive Status: Within Functional Limits for tasks assessed  General Comments      Exercises     Assessment/Plan    PT Assessment Patient needs continued PT services  PT Problem List Decreased strength;Decreased activity tolerance;Decreased mobility;Pain       PT Treatment Interventions Gait training;Stair training;Therapeutic activities;Therapeutic exercise    PT Goals (Current goals can be found in the Care Plan section)  Acute Rehab PT Goals Patient Stated Goal: to get up and walk PT Goal Formulation: With patient Time For Goal Achievement: 12/24/19 Potential to Achieve Goals: Good    Frequency BID   Barriers to discharge Inaccessible home environment      Co-evaluation               AM-PAC PT "6 Clicks" Mobility  Outcome Measure Help needed turning from your back to your side while  in a flat bed without using bedrails?: A Lot Help needed moving from lying on your back to sitting on the side of a flat bed without using bedrails?: A Lot Help needed moving to and from a bed to a chair (including a wheelchair)?: Total Help needed standing up from a chair using your arms (e.g., wheelchair or bedside chair)?: Total Help needed to walk in hospital room?: Total Help needed climbing 3-5 steps with a railing? : Total 6 Click Score: 8    End of Session Equipment Utilized During Treatment: Gait belt Activity Tolerance: Patient limited by pain Patient left: in bed;with bed alarm set Nurse Communication: Mobility status PT Visit Diagnosis: History of falling (Z91.81);Difficulty in walking, not elsewhere classified (R26.2)    Time: 3976-7341 PT Time Calculation (min) (ACUTE ONLY): 30 min   Charges:   PT Evaluation $PT Eval Low Complexity: 1 Low PT Treatments $Therapeutic Activity: 8-22 mins          Ezekiel Ina, PT DPT 12/10/2019, 10:21 AM

## 2019-12-10 NOTE — Progress Notes (Signed)
Subjective: The patient denies any significant pain in regards to her left hip, but does note increased pain to her right ankle, especially when she tried to get up this morning.  She mentioned this to the staff and an x-ray of the right ankle was obtained, the results of which are described below.  The patient denies any specific injury to the ankle, other than not recalling that "everything hurts" after she fell yesterday morning.  She denies any numbness or paresthesias to her right foot.   Objective: Vital signs in last 24 hours: Temp:  [97.4 F (36.3 C)-99.3 F (37.4 C)] 97.9 F (36.6 C) (09/19 0745) Pulse Rate:  [69-85] 79 (09/19 0745) Resp:  [10-23] 17 (09/19 0745) BP: (130-187)/(51-87) 157/55 (09/19 0745) SpO2:  [93 %-100 %] 98 % (09/19 0745)  Intake/Output from previous day: 09/18 0701 - 09/19 0700 In: 2481.4 [P.O.:100; I.V.:2281.4; IV Piggyback:100] Out: 600 [Urine:500; Blood:100] Intake/Output this shift: Total I/O In: 120 [P.O.:120] Out: -   Recent Labs    12/09/19 1115 12/10/19 0347  HGB 13.8 10.2*   Recent Labs    12/09/19 1115 12/10/19 0347  WBC 6.8 6.1  RBC 4.71 3.46*  HCT 41.5 30.8*  PLT 142* 158   Recent Labs    12/09/19 1115 12/10/19 0347  NA 132* 130*  K 4.0 4.6  CL 100 98  CO2 23 26  BUN 7* 10  CREATININE 0.95 1.03*  GLUCOSE 169* 162*  CALCIUM 9.5 8.6*   No results for input(s): LABPT, INR in the last 72 hours.  Physical Exam: Left hip: Dressings notable for small amount of staining, but otherwise dry and intact.  Neurovascularly intact to left lower extremity and foot.  Right ankle: Moderate swelling noted around ankle, but no erythema, ecchymosis, abrasions, or other skin abnormalities are identified.  There is perhaps a small effusion.  She has moderate tenderness to palpation over the medial more so than lateral aspects of the ankle.  She is able dorsiflex and plantarflex her ankle, although with some pain.  She is neurovascularly  intact to her right foot.  X-rays: AP, lateral, and oblique views of the right ankle obtained this morning have been reviewed by myself.  These films demonstrate a mildly displaced fracture of the medial malleolus and a probable nondisplaced distal fibular fracture.  The mortise appears to be well-maintained.  No significant degenerative changes or lytic lesions are noted.  Assessment: 1.  Status post IM nailing of displaced inotrope/subtrochanteric left hip fracture. 2.  Mildly displaced bimalleolar fracture right ankle.  Plan: Although the patient is feeling reasonably well in regards to her left hip, the right ankle fracture most likely will cause problems and is susceptible to displacement if she weightbears on it.  Therefore, I feel that it would be in her best interest to proceed with a formal open reduction and internal fixation of the medial malleolus fracture.  Therefore, she would be able to put some weight on the right ankle in a cam walker boot more safely while recuperating from her left hip procedure.  This procedure has been discussed in detail with the patient, as have the potential risks (including bleeding, infection, nerve and/or blood vessel injury, loosening of and/or failure of the hardware, malunion and/or nonunion, degenerative joint disease, need for further surgery, blood clots, strokes, heart attacks and/or arrhythmias, etc.) and benefits.  The patient states her understanding and wishes to proceed.  A formal written consent will be obtained by the nursing staff.  Excell Seltzer Charrise Lardner 12/10/2019, 1:55 PM

## 2019-12-10 NOTE — Consult Note (Signed)
ANTICOAGULATION CONSULT NOTE  Pharmacy Consult for Xarelto Indication: VTE prophylaxis  Allergies  Allergen Reactions  . Compazine [Prochlorperazine]   . Thorazine [Chlorpromazine]     Patient Measurements: Height: 5\' 8"  (172.7 cm) Weight: 87.1 kg (192 lb) IBW/kg (Calculated) : 63.9   Vital Signs: Temp: 97.9 F (36.6 C) (09/19 0745) Temp Source: Oral (09/19 0416) BP: 157/55 (09/19 0745) Pulse Rate: 79 (09/19 0745)  Labs: Recent Labs    12/09/19 1115 12/10/19 0347  HGB 13.8 10.2*  HCT 41.5 30.8*  PLT 142* 158  CREATININE 0.95 1.03*  TROPONINIHS 4  --     Estimated Creatinine Clearance: 60.4 mL/min (A) (by C-G formula based on SCr of 1.03 mg/dL (H)).    Assessment: Pharmacy has consulted for DVT ppx in a patient with a PMH significant for mitral valve prolapse, HTN, and diabetes. Patient is admitted for Intertrochanteric fracture of left hip. Per consult from Dr. 12/12/19 on 9/18, consult for DVT ppx and to resume Xarelto tomorrow morning - dose unknown to him as well.   Looks like pt is on Xarelto 20 mg daily for A flutter per Care Everywhere  Plan:   Xarelto order d/c'd by MD on 9/19 - on hold for surgery tomorrow  Will need to follow up plan to resume Xarelto on rounds/after surgery tomorrow  Will need SCr and CBC at least every three days per policy  10/19 L 12/10/2019,2:59 PM

## 2019-12-10 NOTE — Progress Notes (Signed)
Patient bp still elevated. 209/61. NP Ouma came and saw patient at bedside. Patient very uncomfortable in low bed. Per Anna Genre it is okay for patient to have regular bed instead.

## 2019-12-10 NOTE — TOC Initial Note (Addendum)
Transition of Care [facility]) - Initial/Assessment Note    Patient Details  Name: Melanie Hammond MRN: [id number] Date of Birth: 1950-08-13  Transition of Care [facility]) CM/SW Contact:    Melanie Deed, LCSW Phone Number: 12/10/2019, 11:00 AM  Clinical Narrative:                 CSW contacted pt to discuss discharge plans, CSW explained to pt that PT recommended SNF for rehab and pt stated that she would prefer to go home with [facility]. Pt is new to the area and has only been here about 2- 3 months. Pt lives with her daughter Melanie Hammond and gave verbal consent to share medical information with her, Pt's address is [address] [place], [place] [address]. CSW contacted pt's daughter to update and she stated that she would like to talk with the pt before they decide on [facility] or SNF.  TOC will continue to follow.  11:20am pt's daughter reached out and stated that they talked it over and would like for pt to go with a SNF for rehab. CSW completed FL2 and PASRR and faxed to surrounding facilities.  Expected Discharge Plan: Home w Home Health Services Barriers to Discharge: Continued Medical Work up   Patient Goals and CMS Choice Patient states their goals for this hospitalization and ongoing recovery are:: to be able to walk on right foot CMS Medicare.gov Compare Post Acute Care list provided to:: Patient Choice offered to / list presented to : Patient  Expected Discharge Plan and Services Expected Discharge Plan: Home w Home Health Services       Living arrangements for the past 2 months: Single Family Home                                      Prior Living Arrangements/Services Living arrangements for the past 2 months: Single Family Home Lives with:: Adult Children Patient language and need for interpreter reviewed:: Yes Do you feel safe going back to the place where you live?: Yes      Need for Family Participation in Patient Care: Yes (Comment) Care giver support system in place?: Yes (comment)  (daughter)   Criminal Activity/Legal Involvement Pertinent to Current Situation/Hospitalization: No - Comment as needed  Activities of Daily Living Home Assistive Devices/Equipment: CBG Meter ADL Screening (condition at time of admission) Patient's cognitive ability adequate to safely complete daily activities?: Yes Is the patient deaf or have difficulty hearing?: No Does the patient have difficulty seeing, even when wearing glasses/contacts?: No Does the patient have difficulty concentrating, remembering, or making decisions?: No Patient able to express need for assistance with ADLs?: No Does the patient have difficulty dressing or bathing?: No Independently performs ADLs?: Yes (appropriate for developmental age) Does the patient have difficulty walking or climbing stairs?: Yes Weakness of Legs: Both Weakness of Arms/Hands: None  Permission Sought/Granted Permission sought to share information with : Facility Industrial/product designer granted to share information with : Yes, Verbal Permission Granted  Share Information with NAME: Advertising account planner granted to share info w Relationship: dughter  Permission granted to share info w Contact Information: [phone]  Emotional Assessment Appearance:: Other (Comment Required (unable to assess) Attitude/Demeanor/Rapport: Unable to Assess Affect (typically observed): Unable to Assess Orientation: : Oriented to Self, Oriented to Place, Oriented to  Time, Oriented to Situation Alcohol / Substance Use: Not Applicable Psych Involvement: No (comment)  Admission diagnosis:  Fracture [T14.8XXA] Intertrochanteric fracture of left hip [facility]) [S72.142A] Patient Active Problem List   Diagnosis Date Noted  . Intertrochanteric fracture of left hip (HCC) 12/09/2019  . Diabetes mellitus (HCC) 12/09/2019  . Nicotine dependence 12/09/2019  . Essential hypertension 12/09/2019  . Obesity (BMI 30-39.9) 12/09/2019  . Fall 12/09/2019   PCP:   Patient, No Pcp Per Pharmacy:   [id number] [place], [place] - [address] AT [facility] [address] [place] [address] Phone: [phone] Fax: [phone]     Social Determinants of Health (SDOH) Interventions    Readmission Risk Interventions No flowsheet data found.

## 2019-12-10 NOTE — Plan of Care (Signed)

## 2019-12-10 NOTE — Progress Notes (Signed)
BP still elevated 215/64. NP notified. Patient having pain of 10/10. Morphine given. Patient immediately threw up.

## 2019-12-10 NOTE — Progress Notes (Signed)
Patient bp 212/94. Retaken on other arm and 194/75. PRN hydralazine 10 mg given.

## 2019-12-10 NOTE — Progress Notes (Signed)
Patient's Daughter called and spoke to Nurse. Daughter concerned for she was unaware of patient going for surgery and was not notified. Daughter requesting rounding physician to call her for an update on her mother. Will report to oncoming Nurse.

## 2019-12-11 ENCOUNTER — Inpatient Hospital Stay: Payer: Medicare HMO | Admitting: Registered Nurse

## 2019-12-11 ENCOUNTER — Encounter: Payer: Self-pay | Admitting: Surgery

## 2019-12-11 ENCOUNTER — Inpatient Hospital Stay: Payer: Medicare HMO

## 2019-12-11 ENCOUNTER — Encounter
Admission: EM | Disposition: A | Discharge status: Skilled Nursing Facility | Payer: Self-pay | Source: Home / Self Care | Attending: Internal Medicine

## 2019-12-11 HISTORY — PX: ORIF ANKLE FRACTURE: SHX5408

## 2019-12-11 LAB — CBC
HCT: 27.7 % — ABNORMAL LOW (ref 36.0–46.0)
Hemoglobin: 9.7 g/dL — ABNORMAL LOW (ref 12.0–15.0)
MCH: 29.4 pg (ref 26.0–34.0)
MCHC: 35 g/dL (ref 30.0–36.0)
MCV: 83.9 fL (ref 80.0–100.0)
Platelets: 143 10*3/uL — ABNORMAL LOW (ref 150–400)
RBC: 3.3 MIL/uL — ABNORMAL LOW (ref 3.87–5.11)
RDW: 14.5 % (ref 11.5–15.5)
WBC: 6.3 10*3/uL (ref 4.0–10.5)
nRBC: 0 % (ref 0.0–0.2)

## 2019-12-11 LAB — BASIC METABOLIC PANEL
Anion gap: 8 (ref 5–15)
BUN: 7 mg/dL — ABNORMAL LOW (ref 8–23)
CO2: 24 mmol/L (ref 22–32)
Calcium: 8.6 mg/dL — ABNORMAL LOW (ref 8.9–10.3)
Chloride: 101 mmol/L (ref 98–111)
Creatinine, Ser: 0.68 mg/dL (ref 0.44–1.00)
GFR calc Af Amer: >60 mL/min (ref >60)
GFR calc non Af Amer: >60 mL/min (ref >60)
Glucose, Bld: 172 mg/dL — ABNORMAL HIGH (ref 70–99)
Potassium: 4.1 mmol/L (ref 3.5–5.1)
Sodium: 133 mmol/L — ABNORMAL LOW (ref 135–145)

## 2019-12-11 LAB — GLUCOSE, CAPILLARY
Glucose-Capillary: 158 mg/dL — ABNORMAL HIGH (ref 70–99)
Glucose-Capillary: 158 mg/dL — ABNORMAL HIGH (ref 70–99)
Glucose-Capillary: 167 mg/dL — ABNORMAL HIGH (ref 70–99)
Glucose-Capillary: 174 mg/dL — ABNORMAL HIGH (ref 70–99)
Glucose-Capillary: 177 mg/dL — ABNORMAL HIGH (ref 70–99)
Glucose-Capillary: 188 mg/dL — ABNORMAL HIGH (ref 70–99)
Glucose-Capillary: 194 mg/dL — ABNORMAL HIGH (ref 70–99)
Glucose-Capillary: 260 mg/dL — ABNORMAL HIGH (ref 70–99)

## 2019-12-11 SURGERY — OPEN REDUCTION INTERNAL FIXATION (ORIF) ANKLE FRACTURE
Anesthesia: General | Site: Ankle | Laterality: Right

## 2019-12-11 MED ORDER — ENSURE MAX PROTEIN PO LIQD
11.0000 [oz_av] | Freq: Every day | ORAL | Status: DC
  Administered 2019-12-12 – 2019-12-14 (×3): 11 [oz_av] via ORAL
  Filled 2019-12-11: qty 330

## 2019-12-11 MED ORDER — ROCURONIUM BROMIDE 100 MG/10ML IV SOLN
INTRAVENOUS | Status: DC | PRN
  Administered 2019-12-11: 50 mg via INTRAVENOUS

## 2019-12-11 MED ORDER — LACTATED RINGERS IV SOLN: INTRAVENOUS | Status: DC | PRN

## 2019-12-11 MED ORDER — METOPROLOL TARTRATE 5 MG/5ML IV SOLN
INTRAVENOUS | Status: DC | PRN
  Administered 2019-12-11: 3 mg via INTRAVENOUS
  Administered 2019-12-11: 2 mg via INTRAVENOUS

## 2019-12-11 MED ORDER — FENTANYL CITRATE (PF) 100 MCG/2ML IJ SOLN
INTRAMUSCULAR | Status: AC
  Filled 2019-12-11: qty 2

## 2019-12-11 MED ORDER — ACETAMINOPHEN 10 MG/ML IV SOLN
INTRAVENOUS | Status: DC | PRN
  Administered 2019-12-11: 1000 mg via INTRAVENOUS

## 2019-12-11 MED ORDER — DEXMEDETOMIDINE HCL IN NACL 400 MCG/100ML IV SOLN
INTRAVENOUS | Status: DC | PRN
  Administered 2019-12-11: 12 ug via INTRAVENOUS
  Administered 2019-12-11 (×2): 4 ug via INTRAVENOUS

## 2019-12-11 MED ORDER — LABETALOL HCL 5 MG/ML IV SOLN
10.0000 mg | INTRAVENOUS | Status: DC | PRN
  Administered 2019-12-11: 10 mg via INTRAVENOUS
  Filled 2019-12-11: qty 4

## 2019-12-11 MED ORDER — PROPOFOL 10 MG/ML IV BOLUS
INTRAVENOUS | Status: DC | PRN
  Administered 2019-12-11: 120 mg via INTRAVENOUS

## 2019-12-11 MED ORDER — PHENYLEPHRINE HCL (PRESSORS) 10 MG/ML IV SOLN
INTRAVENOUS | Status: DC | PRN
  Administered 2019-12-11 (×4): 100 ug via INTRAVENOUS
  Administered 2019-12-11: 200 ug via INTRAVENOUS

## 2019-12-11 MED ORDER — FENTANYL CITRATE (PF) 100 MCG/2ML IJ SOLN: 25.0000 ug | INTRAMUSCULAR | Status: DC | PRN

## 2019-12-11 MED ORDER — CEFAZOLIN SODIUM-DEXTROSE 2-4 GM/100ML-% IV SOLN
INTRAVENOUS | Status: AC
  Filled 2019-12-11: qty 100

## 2019-12-11 MED ORDER — DEXAMETHASONE SODIUM PHOSPHATE 10 MG/ML IJ SOLN
INTRAMUSCULAR | Status: DC | PRN
  Administered 2019-12-11: 5 mg via INTRAVENOUS

## 2019-12-11 MED ORDER — CEFAZOLIN SODIUM-DEXTROSE 2-4 GM/100ML-% IV SOLN
2.0000 g | Freq: Four times a day (QID) | INTRAVENOUS | Status: AC
  Administered 2019-12-11 – 2019-12-12 (×3): 2 g via INTRAVENOUS
  Filled 2019-12-11 (×3): qty 100

## 2019-12-11 MED ORDER — DOCUSATE SODIUM 100 MG PO CAPS: 100.0000 mg | ORAL_CAPSULE | Freq: Two times a day (BID) | ORAL | Status: DC

## 2019-12-11 MED ORDER — ONDANSETRON HCL 4 MG/2ML IJ SOLN: 4.0000 mg | Freq: Once | INTRAMUSCULAR | Status: DC | PRN

## 2019-12-11 MED ORDER — RIVAROXABAN 20 MG PO TABS
20.0000 mg | ORAL_TABLET | Freq: Every day | ORAL | Status: DC
  Administered 2019-12-12 – 2019-12-14 (×3): 20 mg via ORAL
  Filled 2019-12-11 (×4): qty 1

## 2019-12-11 MED ORDER — SUGAMMADEX SODIUM 200 MG/2ML IV SOLN
INTRAVENOUS | Status: DC | PRN
  Administered 2019-12-11: 200 mg via INTRAVENOUS

## 2019-12-11 MED ORDER — ENOXAPARIN SODIUM 40 MG/0.4ML ~~LOC~~ SOLN: 40.0000 mg | SUBCUTANEOUS | Status: DC

## 2019-12-11 MED ORDER — DEXAMETHASONE SODIUM PHOSPHATE 10 MG/ML IJ SOLN
INTRAMUSCULAR | Status: AC
  Filled 2019-12-11: qty 1

## 2019-12-11 MED ORDER — EPHEDRINE 5 MG/ML INJ
INTRAVENOUS | Status: AC
  Filled 2019-12-11: qty 10

## 2019-12-11 MED ORDER — KETAMINE HCL 10 MG/ML IJ SOLN
INTRAMUSCULAR | Status: DC | PRN
  Administered 2019-12-11: 30 mg via INTRAVENOUS

## 2019-12-11 MED ORDER — ONDANSETRON HCL 4 MG PO TABS: 4.0000 mg | ORAL_TABLET | Freq: Four times a day (QID) | ORAL | Status: DC | PRN

## 2019-12-11 MED ORDER — EPHEDRINE SULFATE 50 MG/ML IJ SOLN
INTRAMUSCULAR | Status: DC | PRN
  Administered 2019-12-11: 10 mg via INTRAVENOUS

## 2019-12-11 MED ORDER — PROPOFOL 500 MG/50ML IV EMUL
INTRAVENOUS | Status: AC
  Filled 2019-12-11: qty 50

## 2019-12-11 MED ORDER — BUPIVACAINE HCL (PF) 0.5 % IJ SOLN
INTRAMUSCULAR | Status: AC
  Filled 2019-12-11: qty 10

## 2019-12-11 MED ORDER — METOPROLOL TARTRATE 5 MG/5ML IV SOLN
INTRAVENOUS | Status: AC
  Filled 2019-12-11: qty 5

## 2019-12-11 MED ORDER — ONDANSETRON HCL 4 MG/2ML IJ SOLN
INTRAMUSCULAR | Status: DC | PRN
  Administered 2019-12-11: 4 mg via INTRAVENOUS

## 2019-12-11 MED ORDER — BUPIVACAINE HCL 0.5 % IJ SOLN
INTRAMUSCULAR | Status: DC | PRN
  Administered 2019-12-11: 10 mL

## 2019-12-11 MED ORDER — ONDANSETRON HCL 4 MG/2ML IJ SOLN: 4.0000 mg | Freq: Four times a day (QID) | INTRAMUSCULAR | Status: DC | PRN

## 2019-12-11 MED ORDER — FENTANYL CITRATE (PF) 100 MCG/2ML IJ SOLN
INTRAMUSCULAR | Status: DC | PRN
  Administered 2019-12-11: 100 ug via INTRAVENOUS

## 2019-12-11 MED ORDER — LIDOCAINE HCL (PF) 2 % IJ SOLN
INTRAMUSCULAR | Status: AC
  Filled 2019-12-11: qty 5

## 2019-12-11 MED ORDER — LIDOCAINE HCL (CARDIAC) PF 100 MG/5ML IV SOSY
PREFILLED_SYRINGE | INTRAVENOUS | Status: DC | PRN
  Administered 2019-12-11: 80 mg via INTRAVENOUS

## 2019-12-11 MED ORDER — ACETAMINOPHEN 10 MG/ML IV SOLN
INTRAVENOUS | Status: AC
  Filled 2019-12-11: qty 100

## 2019-12-11 MED ORDER — ONDANSETRON HCL 4 MG/2ML IJ SOLN
INTRAMUSCULAR | Status: AC
  Filled 2019-12-11: qty 2

## 2019-12-11 SURGICAL SUPPLY — 56 items
BIT DRILL 2.9 CANN QC NONSTRL (BIT) ×2 IMPLANT
BLADE SURG SZ10 CARB STEEL (BLADE) ×6 IMPLANT
BNDG COHESIVE 4X5 TAN STRL (GAUZE/BANDAGES/DRESSINGS) ×3 IMPLANT
BNDG ELASTIC 3X5.8 VLCR STR LF (GAUZE/BANDAGES/DRESSINGS) ×2 IMPLANT
BNDG ELASTIC 4X5.8 VLCR STR LF (GAUZE/BANDAGES/DRESSINGS) ×6 IMPLANT
BNDG ELASTIC 6X5.8 VLCR STR LF (GAUZE/BANDAGES/DRESSINGS) ×3 IMPLANT
BNDG ESMARK 6X12 TAN STRL LF (GAUZE/BANDAGES/DRESSINGS) ×3 IMPLANT
BNDG PLASTER FAST 4X5 WHT LF (CAST SUPPLIES) ×12 IMPLANT
CANISTER SUCT 1200ML W/VALVE (MISCELLANEOUS) ×3 IMPLANT
CHLORAPREP W/TINT 26 (MISCELLANEOUS) ×6 IMPLANT
COVER WAND RF STERILE (DRAPES) ×3 IMPLANT
CUFF TOURN DUAL QUICK 18 (TOURNIQUET CUFF) ×2 IMPLANT
CUFF TOURN SGL QUICK 24 (TOURNIQUET CUFF)
CUFF TOURN SGL QUICK 30 (TOURNIQUET CUFF)
CUFF TRNQT CYL 24X4X16.5-23 (TOURNIQUET CUFF) IMPLANT
CUFF TRNQT CYL 30X4X21-28X (TOURNIQUET CUFF) IMPLANT
DRAPE C-ARM XRAY 36X54 (DRAPES) ×5 IMPLANT
DRAPE C-ARMOR (DRAPES) ×3 IMPLANT
DRAPE INCISE IOBAN 66X45 STRL (DRAPES) ×5 IMPLANT
DRAPE SPLIT 6X30 W/TAPE (DRAPES) ×3 IMPLANT
DRAPE U-SHAPE 47X51 STRL (DRAPES) ×3 IMPLANT
ELECT CAUTERY BLADE 6.4 (BLADE) ×3 IMPLANT
ELECT REM PT RETURN 9FT ADLT (ELECTROSURGICAL) ×3
ELECTRODE REM PT RTRN 9FT ADLT (ELECTROSURGICAL) ×1 IMPLANT
GAUZE SPONGE 4X4 12PLY STRL (GAUZE/BANDAGES/DRESSINGS) ×3 IMPLANT
GAUZE XEROFORM 1X8 LF (GAUZE/BANDAGES/DRESSINGS) ×3 IMPLANT
GLOVE BIO SURGEON STRL SZ8 (GLOVE) ×6 IMPLANT
GLOVE INDICATOR 8.0 STRL GRN (GLOVE) ×3 IMPLANT
GOWN STRL REUS W/ TWL LRG LVL3 (GOWN DISPOSABLE) ×1 IMPLANT
GOWN STRL REUS W/ TWL XL LVL3 (GOWN DISPOSABLE) ×1 IMPLANT
GOWN STRL REUS W/TWL LRG LVL3 (GOWN DISPOSABLE) ×2
GOWN STRL REUS W/TWL XL LVL3 (GOWN DISPOSABLE) ×2
HEMOVAC 400ML (MISCELLANEOUS)
K-WIRE ACE 1.6X6 (WIRE) ×6
KIT DRAIN HEMOVAC JP 7FR 400ML (MISCELLANEOUS) ×1 IMPLANT
KIT TURNOVER KIT A (KITS) ×3 IMPLANT
KWIRE ACE 1.6X6 (WIRE) IMPLANT
LABEL OR SOLS (LABEL) ×3 IMPLANT
NS IRRIG 1000ML POUR BTL (IV SOLUTION) ×3 IMPLANT
PACK EXTREMITY (MISCELLANEOUS) ×3 IMPLANT
PAD ABD DERMACEA PRESS 5X9 (GAUZE/BANDAGES/DRESSINGS) ×6 IMPLANT
PAD CAST CTTN 4X4 STRL (SOFTGOODS) ×2 IMPLANT
PAD PREP 24X41 OB/GYN DISP (PERSONAL CARE ITEMS) ×3 IMPLANT
PADDING CAST COTTON 4X4 STRL (SOFTGOODS) ×4
SCREW ACE CAN 4.0 40M (Screw) ×4 IMPLANT
SPONGE LAP 18X18 RF (DISPOSABLE) ×3 IMPLANT
STAPLER SKIN PROX 35W (STAPLE) ×3 IMPLANT
STOCKINETTE IMPERV 14X48 (MISCELLANEOUS) ×3 IMPLANT
SUT VIC AB 0 CT1 36 (SUTURE) ×3 IMPLANT
SUT VIC AB 2-0 SH 27 (SUTURE) ×4
SUT VIC AB 2-0 SH 27XBRD (SUTURE) ×2 IMPLANT
SUT VIC AB 3-0 SH 27 (SUTURE) ×4
SUT VIC AB 3-0 SH 27X BRD (SUTURE) IMPLANT
SYR 10ML LL (SYRINGE) ×3 IMPLANT
WASHER FLAT ACE (Orthopedic Implant) ×2 IMPLANT
WASHER PLAIN FLAT ACE NS 3PK (Orthopedic Implant) IMPLANT

## 2019-12-11 NOTE — Progress Notes (Signed)
Bp now 159/51.

## 2019-12-11 NOTE — Progress Notes (Addendum)
PROGRESS NOTE    Melanie Hammond  ZOX:[id number] DOB: [birth year] DOA: 12/09/2019 PCP: Patient, No Pcp Per    Brief Narrative:  Melanie Hammond is a 69 y.o. female with medical history significant for diabetes mellitus, hypertension, nicotine dependence and history of mitral valve prolapse who was brought into the ER by EMS after she fell landing on her left side.  Patient was unable to get up after her fall due to severe pain.  Patient stated that she fell while walking up some stairs landing on her left side.  Left leg appears to be shortened and internally rotated.  She denies any head trauma loss of consciousness.  She denies feeling dizzy or lightheaded prior to the fall. She complains of shortness of breath but denies having any chest pain, no nausea, no vomiting, no diaphoresis or palpitations. She denies having any abdominal pain or any changes in her bowel habits. Labs show sodium of 132, potassium of 4, chloride 100, bicarb 23, BUN 7, creatinine 0.95, white count 6.8, hemoglobin 13.8, hematocrit 41.5, MCV 88.1, RDW 14.6, platelet count 142 Chest x-ray reviewed by me shows no obvious infiltrate or effusion X-ray of the left hip shows moderately displaced intertrochanteric fracture of the left hip Twelve-lead EKG reviewed by me shows sinus rhythm with ST depression in the lateral leads    Consultants:   Orthopedics  Procedures: Status post IM nailing of displaced subtrochanteric left hip fracture on 9/18  Antimicrobials:   Cefazolin x3 doses   Subjective: Complaining of right ankle pain 8 out of 10 today.  Awaiting to go to the OR this AM.  Objective: Vitals:   12/11/19 0328 12/11/19 0631 12/11/19 0754 12/11/19 1238  BP: (!) 159/51 (!) 167/57 (!) 169/59 (!) 153/57  Pulse: 98 91 89 81  Resp:  18 16 16   Temp: 99.7 F (37.6 C) 99.8 F (37.7 C) 99.4 F (37.4 C) 98.6 F (37 C)  TempSrc: Oral Oral Oral Oral  SpO2: 95%  96% 97%  Weight:      Height:        Intake/Output  Summary (Last 24 hours) at 12/11/2019 1321 Last data filed at 12/11/2019 0306 Gross per 24 hour  Intake 1223.86 ml  Output 3350 ml  Net -2126.14 ml   Filed Weights   12/09/19 0935  Weight: 87.1 kg    Examination: Calm and comfortable Clear to auscultation, no wheeze rales rhonchi's RRR S1-S2 no murmurs rubs gallops Soft abdomen, nondistended nontender positive bowel sounds  left leg wrapped.  Right leg without edema. Alert oriented x3, grossly intact Mood and affect appropriate in current setting     Data Reviewed: I have personally reviewed following labs and imaging studies  CBC: Recent Labs  Lab 12/09/19 1115 12/10/19 0347 12/11/19 0624  WBC 6.8 6.1 6.3  NEUTROABS 5.6  --   --   HGB 13.8 10.2* 9.7*  HCT 41.5 30.8* 27.7*  MCV 88.1 89.0 83.9  PLT 142* 158 143*   Basic Metabolic Panel: Recent Labs  Lab 12/09/19 1115 12/10/19 0347 12/11/19 0624  NA 132* 130* 133*  K 4.0 4.6 4.1  CL 100 98 101  CO2 23 26 24   GLUCOSE 169* 162* 172*  BUN 7* 10 7*  CREATININE 0.95 1.03* 0.68  CALCIUM 9.5 8.6* 8.6*   GFR: Estimated Creatinine Clearance: 77.8 mL/min (by C-G formula based on SCr of 0.68 mg/dL). Liver Function Tests: No results for input(s): AST, ALT, ALKPHOS, BILITOT, PROT, ALBUMIN in the last 168 hours. No results  for input(s): LIPASE, AMYLASE in the last 168 hours. No results for input(s): AMMONIA in the last 168 hours. Coagulation Profile: No results for input(s): INR, PROTIME in the last 168 hours. Cardiac Enzymes: No results for input(s): CKTOTAL, CKMB, CKMBINDEX, TROPONINI in the last 168 hours. BNP (last 3 results) No results for input(s): PROBNP in the last 8760 hours. HbA1C: No results for input(s): HGBA1C in the last 72 hours. CBG: Recent Labs  Lab 12/10/19 2039 12/11/19 0018 12/11/19 0416 12/11/19 0754 12/11/19 1132  GLUCAP 176* 158* 177* 167* 174*   Lipid Profile: No results for input(s): CHOL, HDL, LDLCALC, TRIG, CHOLHDL, LDLDIRECT in  the last 72 hours. Thyroid Function Tests: No results for input(s): TSH, T4TOTAL, FREET4, T3FREE, THYROIDAB in the last 72 hours. Anemia Panel: No results for input(s): VITAMINB12, FOLATE, FERRITIN, TIBC, IRON, RETICCTPCT in the last 72 hours. Sepsis Labs: No results for input(s): PROCALCITON, LATICACIDVEN in the last 168 hours.  Recent Results (from the past 240 hour(s))  SARS Coronavirus 2 by RT PCR (hospital order, performed in [facility] hospital lab) Nasopharyngeal Nasopharyngeal Swab     Status: None   Collection Time: 12/09/19 10:34 AM   Specimen: Nasopharyngeal Swab  Result Value Ref Range Status   SARS Coronavirus 2 NEGATIVE NEGATIVE Final    Comment: (NOTE) SARS-CoV-2 target nucleic acids are NOT DETECTED.  The SARS-CoV-2 RNA is generally detectable in upper and lower respiratory specimens during the acute phase of infection. The lowest concentration of SARS-CoV-2 viral copies this assay can detect is 250 copies / mL. A negative result does not preclude SARS-CoV-2 infection and should not be used as the sole basis for treatment or other patient management decisions.  A negative result may occur with improper specimen collection / handling, submission of specimen other than nasopharyngeal swab, presence of viral mutation(s) within the areas targeted by this assay, and inadequate number of viral copies (<250 copies / mL). A negative result must be combined with clinical observations, patient history, and epidemiological information.  Fact Sheet for Patients:   BoilerBrush.com.cy  Fact Sheet for Healthcare Providers: https://pope.com/  This test is not yet approved or  cleared by the [place] FDA and has been authorized for detection and/or diagnosis of SARS-CoV-2 by FDA under an Emergency Use Authorization (EUA).  This EUA will remain in effect (meaning this test can be used) for the duration of the COVID-19  declaration under Section 564(b)(1) of the Act, 21 U.S.C. section 360bbb-3(b)(1), unless the authorization is terminated or revoked sooner.  Performed at [facility], [address], [place], [place] [address]          Radiology Studies: DG Ankle Complete Right  Result Date: 12/10/2019 CLINICAL DATA:  Right ankle pain and swelling, fall yesterday EXAM: RIGHT ANKLE - COMPLETE 3+ VIEW COMPARISON:  None. FINDINGS: There is a displaced fracture of the right medial malleolus and a probable nondisplaced fracture of the lateral malleolus evidenced by subtle cortical buckling on mortise view. There is no widening of the ankle mortise. The posterior malleolus is intact. Diffuse soft tissue edema about the ankle. IMPRESSION: There is a displaced fracture of the right medial malleolus and a probable nondisplaced fracture of the lateral malleolus evidenced by subtle cortical buckling on mortise view. There is no widening of the ankle mortise. Electronically Signed   By: Lauralyn Primes M.D.   On: 12/10/2019 10:57   DG C-Arm 1-60 Min  Result Date: 12/09/2019 CLINICAL DATA:  The hip fracture EXAM: LEFT FEMUR  2 VIEWS; DG C-ARM 1-60 MIN COMPARISON:  None. FINDINGS: Six intraop views were submitted for review of IM nail fixation the proximal femur fracture. Fluoro time 1 minutes 17 seconds IMPRESSION: Status post ORIF proximal femur fracture with IM nail fixation Electronically Signed   By: Jonna Clark M.D.   On: 12/09/2019 15:51   DG FEMUR MIN 2 VIEWS LEFT  Result Date: 12/09/2019 CLINICAL DATA:  The hip fracture EXAM: LEFT FEMUR 2 VIEWS; DG C-ARM 1-60 MIN COMPARISON:  None. FINDINGS: Six intraop views were submitted for review of IM nail fixation the proximal femur fracture. Fluoro time 1 minutes 17 seconds IMPRESSION: Status post ORIF proximal femur fracture with IM nail fixation Electronically Signed   By: Jonna Clark M.D.   On: 12/09/2019 15:51        Scheduled Meds:  [MAR Hold]  amitriptyline  100 mg Oral QHS   [MAR Hold] buPROPion  150 mg Oral Daily   [MAR Hold] busPIRone  15 mg Oral BID   [MAR Hold] docusate sodium  100 mg Oral BID   [MAR Hold] donepezil  5 mg Oral QPM   [MAR Hold] dorzolamidel-timolol  1 drop Left Eye BID   [MAR Hold] insulin aspart  0-15 Units Subcutaneous Q4H   [MAR Hold] latanoprost  1 drop Left Eye QHS   [MAR Hold] levothyroxine  50 mcg Oral Q0600   [MAR Hold] metoprolol tartrate  25 mg Oral BID   [MAR Hold] nicotine  14 mg Transdermal Daily   [MAR Hold] pantoprazole  40 mg Oral Daily   [MAR Hold] promethazine  12.5 mg Intravenous Once   [MAR Hold] sertraline  50 mg Oral Daily   [MAR Hold] traZODone  100 mg Oral QHS   [MAR Hold] verapamil  240 mg Oral Daily   Continuous Infusions:  sodium chloride 75 mL/hr at 12/11/19 0008   ceFAZolin     [MAR Hold]  ceFAZolin (ANCEF) IV     [MAR Hold] methocarbamol (ROBAXIN) IV      Assessment & Plan:   Principal Problem:   Intertrochanteric fracture of left hip (HCC) Active Problems:   Diabetes mellitus (HCC)   Nicotine dependence   Essential hypertension   Obesity (BMI 30-39.9)   Fall   Intertrochanteric fracture of left hip-status post mechanical fall Left hip x-ray revealed moderately displaced intertrochanteric fracture of the left hip Ortho following Patient is status post IM nailing of displaced subtrochanteric left hip fracture Plan for the OR today for the right ankle fracture Pain control Bowel regimen PT OT evaluation after right ankle fracture   Right Ankle pain-patient was having right ankle pain yesterday unable to put weight bearing with PT.  She complained of this after her hip surgery.   -Ankle x-ray revealed displaced fracture of the right medial malleolus and a probable nondisplaced fracture of the lateral malleolus Plan to OR today for nailing  Hyponatremia- Sodium had dropped to 130 on 9/19.  Likely from dehydration as sodium level has  improved with IV normal saline hydration overnight.  Sodium is 133 today.      Nicotine dependence Patient smokes 1/2 pack of cigarettes daily Counseled on smoking cessation  On nicotine patch  Essential hypertension Only uncontrolled, likely her pain is attributing to her elevated blood pressure.  She was restarted on her home medications and will continue monitoring and make adjustments accordingly  Optimize pain management    Diabetes mellitus R-ISS, hypoglycemic protocol  Obesity Complicates overall prognosis and care   DVT  prophylaxis: SCD, Xarelto on hold for OR today Code Status: Full Family Communication: None at bedside Status is: Inpatient  Remains inpatient appropriate because:Ongoing diagnostic testing needed not appropriate for outpatient work up   Dispo: The patient is from: Home              Anticipated d/c is to: snf v.s. home, likely snf              Anticipated d/c date is: 3 days              Patient currently is not medically stable to d/c.  Plan for surgery today           LOS: 2 days   Time spent:35 min with >50% on coc    Lynn ItoSahar Natasja Niday, MD [facility] Pager [phone]  If 7PM-7AM, please contact night-coverage [email or web address] Password [email or web address] 12/11/2019, 1:21 PM

## 2019-12-11 NOTE — Transfer of Care (Signed)
Immediate Anesthesia Transfer of Care Note  Patient: Melanie Hammond  Procedure(s) Performed: OPEN REDUCTION INTERNAL FIXATION (ORIF) ANKLE FRACTURE (Right Ankle)  Patient Location: PACU  Anesthesia Type:General  Level of Consciousness: drowsy  Airway & Oxygen Therapy: Patient Spontanous Breathing and Patient connected to face mask oxygen  Post-op Assessment: Report given to RN and Post -op Vital signs reviewed and stable  Post vital signs: Reviewed and stable  Last Vitals:  Vitals Value Taken Time  BP 148/60 12/11/19 1511  Temp    Pulse 73 12/11/19 1517  Resp 19 12/11/19 1517  SpO2 100 % 12/11/19 1517  Vitals shown include unvalidated device data.  Last Pain:  Vitals:   12/11/19 1238  TempSrc: Oral  PainSc: 6       Patients Stated Pain Goal: 4 (12/11/19 0631)  Complications: No complications documented.

## 2019-12-11 NOTE — Progress Notes (Signed)
  Subjective: 2 Days Post-Op Procedure(s) (LRB): INTRAMEDULLARY (IM) NAIL INTERTROCHANTRIC (Left) Patient reports pain as moderate.   Patient is well, and has had no acute complaints or problems.  She is still complaining of her right ankle with soreness.  Plan is to do the right ankle ORIF today.  Her left hip is doing well since surgery. Plan is to go Rehab after hospital stay. Negative for chest pain and shortness of breath Fever: no Gastrointestinal: Negative for nausea and vomiting  Objective: Vital signs in last 24 hours: Temp:  [97.9 F (36.6 C)-100.2 F (37.9 C)] 99.8 F (37.7 C) (09/20 0631) Pulse Rate:  [79-115] 91 (09/20 0631) Resp:  [17-20] 18 (09/20 0631) BP: (153-215)/(51-75) 167/57 (09/20 0631) SpO2:  [95 %-100 %] 95 % (09/20 0328)  Intake/Output from previous day:  Intake/Output Summary (Last 24 hours) at 12/11/2019 0711 Last data filed at 12/11/2019 0306 Gross per 24 hour  Intake 1343.86 ml  Output 3350 ml  Net -2006.14 ml    Intake/Output this shift: No intake/output data recorded.  Labs: Recent Labs    12/09/19 1115 12/10/19 0347 12/11/19 0624  HGB 13.8 10.2* 9.7*   Recent Labs    12/10/19 0347 12/11/19 0624  WBC 6.1 6.3  RBC 3.46* 3.30*  HCT 30.8* 27.7*  PLT 158 143*   Recent Labs    12/09/19 1115 12/10/19 0347  NA 132* 130*  K 4.0 4.6  CL 100 98  CO2 23 26  BUN 7* 10  CREATININE 0.95 1.03*  GLUCOSE 169* 162*  CALCIUM 9.5 8.6*   No results for input(s): LABPT, INR in the last 72 hours.   EXAM General - Patient is Alert and Oriented Extremity - Neurovascular intact Sensation intact distally Compartment soft Dressing/Incision - clean, dry, no drainage Motor Function - intact, moving foot and toes well on exam.   Past Medical History:  Diagnosis Date  . Diabetes mellitus without complication (HCC)   . Hypertension     Assessment/Plan: 2 Days Post-Op Procedure(s) (LRB): INTRAMEDULLARY (IM) NAIL INTERTROCHANTRIC  (Left) Principal Problem:   Intertrochanteric fracture of left hip (HCC) Active Problems:   Diabetes mellitus (HCC)   Nicotine dependence   Essential hypertension   Obesity (BMI 30-39.9)   Fall  Estimated body mass index is 29.19 kg/m as calculated from the following:   Height as of this encounter: 5\' 8"  (1.727 m).   Weight as of this encounter: 87.1 kg. Up with therapy after ankle surgery. N.p.o. today. Discharge planning. Follow-up at [facility] clinic orthopedics in 2 weeks after ankle surgery.  DVT Prophylaxis - Xarelto and TED hose Weight-Bearing as tolerated to left leg and non weightbearing on the right  [facility], PA-C Orthopaedic Surgery 12/11/2019, 7:11 AM

## 2019-12-11 NOTE — H&P (Signed)
H&P, consult, and progress notes reviewed and patient re-examined. No changes.

## 2019-12-11 NOTE — Progress Notes (Signed)
Physical Therapy Treatment Patient Details Name: Melanie Hammond MRN: [id number] DOB: Feb 12, 1951 Today's Date: 12/11/2019    History of Present Illness Per MD note:Melanie Hammond is a 69 y.o. female with medical history significant for diabetes mellitus, hypertension, nicotine dependence and history of mitral valve prolapse who was brought into the ER by EMS after she fell landing on her left side.  Patient was unable to get up after her fall due to severe pain.  Patient stated that she fell while walking up some stairs landing on her left side.  Left leg appears to be shortened and internally rotated.  She denies any head trauma loss of consciousness.  She denies feeling dizzy or lightheaded prior to the fall.    PT Comments    Chart reviewed.  Planned surgery R ankle today due to fx.  Session limited to supine LLE ex's per hip protocol.  Pt with limited ROM due to pain.  Deferred RLE or further mobility at this time until after surgery.  Will continue as appropriate.   Follow Up Recommendations  SNF     Equipment Recommendations  Rolling walker with 5" wheels    Recommendations for Other Services       Precautions / Restrictions Precautions Precautions: Fall Restrictions Weight Bearing Restrictions: Yes RLE Weight Bearing: Non weight bearing LLE Weight Bearing: Weight bearing as tolerated    Mobility  Bed Mobility               General bed mobility comments: deferred due to surgery planned for R ankle today  Transfers                    Ambulation/Gait                 Stairs             Wheelchair Mobility    Modified Rankin (Stroke Patients Only)       Balance                                            Cognition Arousal/Alertness: Awake/alert Behavior During Therapy: WFL for tasks assessed/performed Overall Cognitive Status: Within Functional Limits for tasks assessed                                         Exercises Other Exercises Other Exercises: LLE P/AAROM per hip protocol    General Comments        Pertinent Vitals/Pain Pain Assessment: Faces Faces Pain Scale: Hurts whole lot Pain Location: with ROM LLE in hip/quad Pain Descriptors / Indicators: Operative site guarding;Sharp Pain Intervention(s): Limited activity within patient's tolerance;Monitored during session;Repositioned    Home Living                      Prior Function            PT Goals (current goals can now be found in the care plan section) Progress towards PT goals: Progressing toward goals    Frequency    BID      PT Plan Current plan remains appropriate    Co-evaluation              AM-PAC PT "6 Clicks" Mobility   Outcome Measure  Help needed  turning from your back to your side while in a flat bed without using bedrails?: A Lot Help needed moving from lying on your back to sitting on the side of a flat bed without using bedrails?: A Lot Help needed moving to and from a bed to a chair (including a wheelchair)?: Total Help needed standing up from a chair using your arms (e.g., wheelchair or bedside chair)?: Total Help needed to walk in hospital room?: Total Help needed climbing 3-5 steps with a railing? : Total 6 Click Score: 8    End of Session   Activity Tolerance: Patient limited by pain Patient left: in bed;with bed alarm set;with call bell/phone within reach Nurse Communication: Mobility status       Time: 4431-5400 PT Time Calculation (min) (ACUTE ONLY): 15 min  Charges:  $Therapeutic Exercise: 8-22 mins                    Danielle Dess, PTA 12/11/19, 9:13 AM

## 2019-12-11 NOTE — Op Note (Signed)
12/11/2019  3:05 PM  Patient:   Melanie Hammond  Pre-Op Diagnosis:   Mildly displaced medial malleolar fracture, right ankle.  Post-Op Diagnosis:   Same.  Procedure:   Open reduction and internal fixation of medial malleolar fracture, right ankle.  Surgeon:   Maryagnes Amos, MD  Assistant:   None  Anesthesia:   GET  Findings:   As above.  Complications:   None  EBL:   5 cc  Fluids:   200 cc crystalloid  UOP:   None  TT:   30 min at 250 mmHg  Drains:   None  Closure:   Staples  Implants:   4.0 mm partially-threaded cannulated cancellous screws x2  Brief Clinical Note:   The patient is a 69 year old female who apparently lost her balance and fell at her daughter's home 2 days ago while trying to climb up several steps. She was brought to the emergency room where x-rays demonstrated a displaced intertrochanteric/subtrochanteric left hip fracture. She underwent intramedullary nailing of this fracture 2 days ago. Yesterday, while attempting to get up with physical therapy, she complained of increased right ankle pain. Subsequent x-rays demonstrated a mildly displaced medial malleolus fracture with an incomplete stable transverse fracture of the distal fibula given the patient's already compromised ability to ambulate, it was felt best to stabilize the ankle as well as possible. Therefore, the patient presents at this time for an open reduction and internal fixation of the displaced medial malleolus fracture.  Procedure:   The patient was brought into the operating room and lain in the supine position.  After adequate general endotracheal intubation and anesthesia were obtained, the right foot and lower leg were prepped with ChloraPrep solution, then draped sterilely. Preoperative antibiotics were administered. A timeout was performed to verify the appropriate surgical site before the limb was exsanguinated with an Esmarch and the calf tourniquet inflated to 250 mmHg.  Attention was  directed to the medial side. An approximately 3 cm long curvilinear incision was made over the anterior and distal portions of the medial malleolus. This incision also was carried down through the subcutaneous tissues to expose the fracture site. Care was taken to identify and protect the saphenous nerve and vein. The fracture hematoma was debrided before the fracture was reduced. Two guidewires were placed obliquely across the fracture from distal to proximal into the distal tibial metaphysis. After verifying their positions fluoroscopically, each guidewire was sequentially over-reamed and replaced with a 40 mm partially threaded 4.0 cancellous screw in lag fashion. Given the patient's poor bone quality, a washer was used for the more anterior screw. The adequacy of fracture reduction, hardware position, and mortise restoration was verified in AP, lateral, and oblique projections and found to be excellent.  The medial ankle wound was copiously irrigated with sterile saline solution. The subcutaneous tissues were closed using 3-0 Vicryl interrupted sutures before the skin was closed using staples. A total of 10 cc of 0.5% plain Sensorcaine was injected in and around the incision sites to help with postoperative analgesia. A sterile bulky dressing was applied to the wound before the patient was placed into a cam walker boot, maintaining the ankle in neutral dorsiflexion. The patient was then awakened, extubated, and returned to the recovery room in satisfactory condition after tolerating the procedure well.

## 2019-12-11 NOTE — Progress Notes (Signed)
Patient bp 180/73. NP notified. PRN labetalol ordered and given. Will reassess bp in 30 minutes.

## 2019-12-11 NOTE — Anesthesia Preprocedure Evaluation (Addendum)
Anesthesia Evaluation  Patient identified by MRN, date of birth, ID band Patient awake    Reviewed: Allergy & Precautions, H&P , NPO status , Patient's Chart, lab work & pertinent test results, reviewed documented beta blocker date and time   Airway Mallampati: II  TM Distance: >3 FB Neck ROM: full    Dental  (+) Teeth Intact   Pulmonary neg pulmonary ROS, Current Smoker and Patient abstained from smoking.,    Pulmonary exam normal        Cardiovascular Exercise Tolerance: Good hypertension, negative cardio ROS Normal cardiovascular exam Rate:Normal     Neuro/Psych negative neurological ROS  negative psych ROS   GI/Hepatic negative GI ROS, Neg liver ROS,   Endo/Other  negative endocrine ROSdiabetes  Renal/GU negative Renal ROS  negative genitourinary   Musculoskeletal   Abdominal   Peds  Hematology negative hematology ROS (+)   Anesthesia Other Findings   Reproductive/Obstetrics negative OB ROS                            Anesthesia Physical Anesthesia Plan  ASA: III and emergent  Anesthesia Plan: General   Post-op Pain Management:    Induction: Intravenous  PONV Risk Score and Plan: 1 and 3  Airway Management Planned: Video Laryngoscope Planned and Oral ETT  Additional Equipment:   Intra-op Plan:   Post-operative Plan:   Informed Consent: I have reviewed the patients History and Physical, chart, labs and discussed the procedure including the risks, benefits and alternatives for the proposed anesthesia with the patient or authorized representative who has indicated his/her understanding and acceptance.       Plan Discussed with: CRNA  Anesthesia Plan Comments: (Consent per Joice Lofts (daughter) at 1254 and plan accepted by patient.  She refuses SAB. JA)       Anesthesia Quick Evaluation

## 2019-12-11 NOTE — Consult Note (Signed)
ANTICOAGULATION CONSULT NOTE  Pharmacy Consult for Xarelto Indication: VTE prophylaxis  Allergies  Allergen Reactions   Compazine [Prochlorperazine]    Thorazine [Chlorpromazine]     Patient Measurements: Height: 5\' 8"  (172.7 cm) Weight: 87.1 kg (192 lb) IBW/kg (Calculated) : 63.9   Vital Signs: Temp: 98.7 F (37.1 C) (09/20 1618) Temp Source: Oral (09/20 1618) BP: 162/60 (09/20 1618) Pulse Rate: 77 (09/20 1618)  Labs: Recent Labs    12/09/19 1115 12/09/19 1115 12/10/19 0347 12/11/19 0624  HGB 13.8   < > 10.2* 9.7*  HCT 41.5  --  30.8* 27.7*  PLT 142*  --  158 143*  CREATININE 0.95  --  1.03* 0.68  TROPONINIHS 4  --   --   --    < > = values in this interval not displayed.    Estimated Creatinine Clearance: 77.8 mL/min (by C-G formula based on SCr of 0.68 mg/dL).    Assessment: Pharmacy has consulted for DVT ppx in a patient with a PMH significant for mitral valve prolapse, HTN, and diabetes. Patient is admitted for Intertrochanteric fracture of left hip. Per consult from Dr. 12/13/19 on 9/20, consult for DVT ppx and to resume Xarelto tomorrow morning.   Looks like pt is on Xarelto 20 mg daily for A flutter per Care Everywhere  Plan:   Patient had 2nd surgery for right ankle fracture on 9/20  Will resume Xarelto 20 mg daily with breakfast on 9/21  Will need SCr and CBC at least every three days per policy  10/21, PharmD, BCPS 12/11/2019 6:05 PM

## 2019-12-11 NOTE — Progress Notes (Signed)
Initial Nutrition Assessment  DOCUMENTATION CODES:   Not applicable  INTERVENTION:  Provide Ensure Max Protein po once daily, each supplement provides 150 kcal and 30 grams of protein.  NUTRITION DIAGNOSIS:   Increased nutrient needs related to post-op healing as evidenced by estimated needs.  GOAL:   Patient will meet greater than or equal to 90% of their needs  MONITOR:   PO intake, Supplement acceptance, Labs, Weight trends, I & O's, Skin  REASON FOR ASSESSMENT:   Consult Assessment of nutrition requirement/status  ASSESSMENT:   69 year old female with PMHx of HTN, DM, hx mitral valve prolapse admitted after a fall found to have intertrochanteric fracute of left hip s/p IM nailing on 9/18, also with right ankle pain found to have mildly displaced fracture of medial malleolus and probable nondisplaced distal fibular fracture.   -Plan is for ORIF of right ankle today.  Met with patient at bedside this morning. She reports her appetite is good and she has been eating well at meals. She reports she ate 75-100% of her meals yesterday. She reports her appetite is usually fairly good at home. She has been NPO today for OR. Discussed increased nutrient needs for healing. Patient is amenable to drinking Ensure Max Protein to help meet protein needs.  Patient reports she is weight stable and her UBW is 192 lbs. Patient currently documented to be 87.1 kg (192 lbs).  Medications reviewed and include: Colace 100 mg BID, Novolog 0-15 units Q4hrs, levothyroxine, nicotine patch, Protonix, NS at 75 mL/hr.  Labs reviewed: CBG 158-177, Sodium 133, BUN 7.  Patient does not meet criteria for malnutrition at this time.  NUTRITION - FOCUSED PHYSICAL EXAM:    Most Recent Value  Orbital Region No depletion  Upper Arm Region No depletion  Thoracic and Lumbar Region No depletion  Buccal Region No depletion  Temple Region No depletion  Clavicle Bone Region No depletion  Clavicle and  Acromion Bone Region No depletion  Scapular Bone Region No depletion  Dorsal Hand No depletion  Patellar Region No depletion  Anterior Thigh Region No depletion  Posterior Calf Region Unable to assess  Edema (RD Assessment) Unable to assess  Hair Reviewed  Eyes Reviewed  Mouth Reviewed  Skin Reviewed  Nails Reviewed     Diet Order:   Diet Order            Diet NPO time specified Except for: Sips with Meds  Diet effective midnight                EDUCATION NEEDS:   No education needs have been identified at this time  Skin:  Skin Assessment: Skin Integrity Issues: (closed incision left hip)  Last BM:  12/09/2019 per chart  Height:   Ht Readings from Last 1 Encounters:  12/10/19 _0  (1.727 m)   Weight:   Wt Readings from Last 1 Encounters:  12/09/19 87.1 kg   BMI:  Body mass index is 29.19 kg/m.  Estimated Nutritional Needs:   Kcal:  2100-2300  Protein:  105-115 grams  Fluid:  2 L/day  Jacklynn Barnacle, MS, RD, LDN Pager number available on Amion

## 2019-12-11 NOTE — Anesthesia Procedure Notes (Signed)
Procedure Name: Intubation Date/Time: 12/11/2019 2:11 PM Performed by: Lynden Oxford, CRNA Pre-anesthesia Checklist: Patient identified, Emergency Drugs available, Suction available and Patient being monitored Patient Re-evaluated:Patient Re-evaluated prior to induction Oxygen Delivery Method: Circle system utilized Preoxygenation: Pre-oxygenation with 100% oxygen Induction Type: IV induction Ventilation: Mask ventilation without difficulty Laryngoscope Size: McGraph and 3 Grade View: Grade I Tube type: Oral Tube size: 7.0 mm Number of attempts: 1 Airway Equipment and Method: Stylet,  Oral airway and Video-laryngoscopy Placement Confirmation: ETT inserted through vocal cords under direct vision,  positive ETCO2 and breath sounds checked- equal and bilateral Secured at: 21 cm Tube secured with: Tape Dental Injury: Teeth and Oropharynx as per pre-operative assessment

## 2019-12-12 ENCOUNTER — Encounter: Payer: Self-pay | Admitting: Surgery

## 2019-12-12 LAB — BASIC METABOLIC PANEL
Anion gap: 6 (ref 5–15)
BUN: 9 mg/dL (ref 8–23)
CO2: 27 mmol/L (ref 22–32)
Calcium: 8.9 mg/dL (ref 8.9–10.3)
Chloride: 102 mmol/L (ref 98–111)
Creatinine, Ser: 0.64 mg/dL (ref 0.44–1.00)
GFR calc Af Amer: >60 mL/min (ref >60)
GFR calc non Af Amer: >60 mL/min (ref >60)
Glucose, Bld: 164 mg/dL — ABNORMAL HIGH (ref 70–99)
Potassium: 4.5 mmol/L (ref 3.5–5.1)
Sodium: 135 mmol/L (ref 135–145)

## 2019-12-12 LAB — GLUCOSE, CAPILLARY
Glucose-Capillary: 149 mg/dL — ABNORMAL HIGH (ref 70–99)
Glucose-Capillary: 155 mg/dL — ABNORMAL HIGH (ref 70–99)
Glucose-Capillary: 214 mg/dL — ABNORMAL HIGH (ref 70–99)
Glucose-Capillary: 154 mg/dL — ABNORMAL HIGH (ref 70–99)
Glucose-Capillary: 157 mg/dL — ABNORMAL HIGH (ref 70–99)
Glucose-Capillary: 165 mg/dL — ABNORMAL HIGH (ref 70–99)

## 2019-12-12 LAB — CBC
HCT: 25.7 % — ABNORMAL LOW (ref 36.0–46.0)
Hemoglobin: 8.5 g/dL — ABNORMAL LOW (ref 12.0–15.0)
MCH: 29.3 pg (ref 26.0–34.0)
MCHC: 33.1 g/dL (ref 30.0–36.0)
MCV: 88.6 fL (ref 80.0–100.0)
Platelets: 133 10*3/uL — ABNORMAL LOW (ref 150–400)
RBC: 2.9 MIL/uL — ABNORMAL LOW (ref 3.87–5.11)
RDW: 14.5 % (ref 11.5–15.5)
WBC: 6.2 10*3/uL (ref 4.0–10.5)
nRBC: 0 % (ref 0.0–0.2)

## 2019-12-12 MED ORDER — IRBESARTAN 150 MG PO TABS
300.0000 mg | ORAL_TABLET | Freq: Every day | ORAL | Status: DC
  Administered 2019-12-12 – 2019-12-14 (×3): 300 mg via ORAL
  Filled 2019-12-12 (×3): qty 2

## 2019-12-12 NOTE — Care Management Important Message (Signed)
Important Message  Patient Details  Name: Melanie Hammond MRN: [id number] Date of Birth: 11-Nov-1950   Medicare Important Message Given:  Yes     Johnell Comings 12/12/2019, 11:15 AM

## 2019-12-12 NOTE — Plan of Care (Signed)
  Problem: Education: Goal: Knowledge of General Education information will improve Description: Including pain rating scale, medication(s)/side effects and non-pharmacologic comfort measures 12/12/2019 0458 by Deirdre Pippins, RN Outcome: Progressing 12/12/2019 0452 by Deirdre Pippins, RN Outcome: Progressing   Problem: Health Behavior/Discharge Planning: Goal: Ability to manage health-related needs will improve 12/12/2019 0458 by Deirdre Pippins, RN Outcome: Progressing 12/12/2019 0452 by Deirdre Pippins, RN Outcome: Progressing   Problem: Clinical Measurements: Goal: Ability to maintain clinical measurements within normal limits will improve 12/12/2019 0458 by Deirdre Pippins, RN Outcome: Progressing 12/12/2019 0452 by Deirdre Pippins, RN Outcome: Progressing Goal: Will remain free from infection 12/12/2019 0458 by Deirdre Pippins, RN Outcome: Progressing 12/12/2019 0452 by Deirdre Pippins, RN Outcome: Progressing Goal: Diagnostic test results will improve 12/12/2019 0458 by Deirdre Pippins, RN Outcome: Progressing 12/12/2019 0452 by Deirdre Pippins, RN Outcome: Progressing Goal: Respiratory complications will improve 12/12/2019 0458 by Deirdre Pippins, RN Outcome: Progressing 12/12/2019 0452 by Deirdre Pippins, RN Outcome: Progressing Goal: Cardiovascular complication will be avoided 12/12/2019 0458 by Deirdre Pippins, RN Outcome: Progressing 12/12/2019 0452 by Deirdre Pippins, RN Outcome: Progressing   Problem: Activity: Goal: Risk for activity intolerance will decrease 12/12/2019 0458 by Deirdre Pippins, RN Outcome: Progressing 12/12/2019 0452 by Deirdre Pippins, RN Outcome: Progressing   Problem: Nutrition: Goal: Adequate nutrition will be maintained 12/12/2019 0458 by Deirdre Pippins, RN Outcome: Progressing 12/12/2019 0452 by Deirdre Pippins, RN Outcome: Progressing   Problem: Coping: Goal: Level of anxiety will decrease 12/12/2019  0458 by Deirdre Pippins, RN Outcome: Progressing 12/12/2019 0452 by Deirdre Pippins, RN Outcome: Progressing   Problem: Elimination: Goal: Will not experience complications related to bowel motility 12/12/2019 0458 by Deirdre Pippins, RN Outcome: Progressing 12/12/2019 0452 by Deirdre Pippins, RN Outcome: Progressing Goal: Will not experience complications related to urinary retention 12/12/2019 0458 by Deirdre Pippins, RN Outcome: Progressing 12/12/2019 0452 by Deirdre Pippins, RN Outcome: Progressing   Problem: Pain Managment: Goal: General experience of comfort will improve 12/12/2019 0458 by Deirdre Pippins, RN Outcome: Progressing 12/12/2019 0452 by Deirdre Pippins, RN Outcome: Progressing   Problem: Safety: Goal: Ability to remain free from injury will improve 12/12/2019 0458 by Deirdre Pippins, RN Outcome: Progressing 12/12/2019 0452 by Deirdre Pippins, RN Outcome: Progressing   Problem: Skin Integrity: Goal: Risk for impaired skin integrity will decrease 12/12/2019 0458 by Deirdre Pippins, RN Outcome: Progressing 12/12/2019 0452 by Deirdre Pippins, RN Outcome: Progressing

## 2019-12-12 NOTE — Progress Notes (Signed)
Pt's IV infiltrated, IV team got a new IV. Pt scheduled meds given. Pt denies any further needs at this time. Call bell within reach.

## 2019-12-12 NOTE — Progress Notes (Signed)
Physical Therapy Treatment Patient Details Name: Melanie Hammond MRN: [id number] DOB: 04/15/1950 Today's Date: 12/12/2019    History of Present Illness Melanie Hammond is a 69 y.o. female with medical history significant for diabetes mellitus, hypertension, nicotine dependence and history of mitral valve prolapse who was brought into the ER by EMS after she fell on stairs landing on her left side.  Patient was unable to get up after her fall due to severe pain.  Left femur fracture with ORIF IM nailing, then ORIF of the L ankle fracture 9/20.    PT Comments    Pt c/o pain on arrival, but had recently gotten meds.  She was hesitant to do a lot but ultimately showed good effort and willingness to work with PT despite pain and hesitancy.  She continues to have a lot of pain and need AAROM with most L LE acts, better motion and tolerance on the R.  Pt continues to struggle with mobility, transfers, gait.  She was highly UE reliant with all mobility, but still required heavy assist and elevated bed to rise.  She was able to do only minimal very slow and highly guarded side stepping along EOB again with heavy UE use on walker and close assist from PT.    Follow Up Recommendations  SNF     Equipment Recommendations  Rolling walker with 5" wheels    Recommendations for Other Services       Precautions / Restrictions Precautions Precautions: Fall Restrictions RLE Weight Bearing: Weight bearing as tolerated (with CAM boot) LLE Weight Bearing: Weight bearing as tolerated    Mobility  Bed Mobility Overal bed mobility: Needs Assistance Bed Mobility: Supine to Sit     Supine to sit: Mod assist Sit to supine: Max assist   General bed mobility comments: Pt struggled to move L LE toward EOB, unable to elevate torso/get to sitting w/o mod assist.. Heavy assist with getting LEs back into bed   Transfers Overall transfer level: Needs assistance Equipment used: Rolling walker (2 wheeled) Transfers: Sit  to/from Stand Sit to Stand: Mod assist;+2 physical assistance;From elevated surface         General transfer comment: Pt struggled with getting to standing, more assistance to rise that this AM from similar height  Ambulation/Gait Ambulation/Gait assistance: Min assist   Assistive device: Rolling walker (2 wheeled)       General Gait Details: deferred actual ambulation, pt struggled to side step along EOB with very slow, shuffling steps, heavy UE use and poor tolerance   Stairs             Wheelchair Mobility    Modified Rankin (Stroke Patients Only)       Balance Overall balance assessment: Needs assistance Sitting-balance support: Bilateral upper extremity supported Sitting balance-Leahy Scale: Fair     Standing balance support: Bilateral upper extremity supported Standing balance-Leahy Scale: Poor Standing balance comment: very poor confidence, RW reliant                            Cognition Arousal/Alertness: Awake/alert Behavior During Therapy: WFL for tasks assessed/performed Overall Cognitive Status: Within Functional Limits for tasks assessed                                        Exercises General Exercises - Lower Extremity Ankle Circles/Pumps: Left;Strengthening;10 reps Quad Sets:  Strengthening;10 reps;Left Short Arc Quad: 10 reps;AAROM;Left Heel Slides: Strengthening;10 reps;AAROM Hip ABduction/ADduction: AAROM;Strengthening;10 reps (resisted on R, AROM tolerated only on L)    General Comments        Pertinent Vitals/Pain Pain Assessment: 0-10 Pain Score: 8  Pain Location: L thigh/hip, less in R ankle    Home Living                      Prior Function            PT Goals (current goals can now be found in the care plan section) Progress towards PT goals: Progressing toward goals    Frequency    BID      PT Plan Current plan remains appropriate    Co-evaluation               AM-PAC PT "6 Clicks" Mobility   Outcome Measure  Help needed turning from your back to your side while in a flat bed without using bedrails?: A Lot Help needed moving from lying on your back to sitting on the side of a flat bed without using bedrails?: A Lot Help needed moving to and from a bed to a chair (including a wheelchair)?: A Lot Help needed standing up from a chair using your arms (e.g., wheelchair or bedside chair)?: A Lot Help needed to walk in hospital room?: A Lot Help needed climbing 3-5 steps with a railing? : Total 6 Click Score: 11    End of Session Equipment Utilized During Treatment: Gait belt Activity Tolerance: Patient limited by pain Patient left: with call bell/phone within reach;with bed alarm set Nurse Communication: Mobility status PT Visit Diagnosis: History of falling (Z91.81);Difficulty in walking, not elsewhere classified (R26.2);Unsteadiness on feet (R26.81);Other abnormalities of gait and mobility (R26.89);Pain Pain - Right/Left: Left Pain - part of body: Hip     Time: 1535-1606 PT Time Calculation (min) (ACUTE ONLY): 31 min  Charges:  $Gait Training: 8-22 mins $Therapeutic Exercise: 8-22 mins                     Malachi Pro, DPT 12/12/2019, 5:02 PM

## 2019-12-12 NOTE — Anesthesia Postprocedure Evaluation (Signed)
Anesthesia Post Note  Patient: Melanie Hammond  Procedure(s) Performed: OPEN REDUCTION INTERNAL FIXATION (ORIF) ANKLE FRACTURE (Right Ankle)  Patient location during evaluation: PACU Anesthesia Type: General Level of consciousness: awake and alert Pain management: pain level controlled Vital Signs Assessment: post-procedure vital signs reviewed and stable Respiratory status: spontaneous breathing, nonlabored ventilation, respiratory function stable and patient connected to nasal cannula oxygen Cardiovascular status: blood pressure returned to baseline and stable Postop Assessment: no apparent nausea or vomiting Anesthetic complications: no   No complications documented.   Last Vitals:  Vitals:   12/12/19 0408 12/12/19 0742  BP: (!) 184/65 (!) 192/69  Pulse: 79 86  Resp: 16 18  Temp: 37.2 C 37.2 C  SpO2: 99% 99%    Last Pain:  Vitals:   12/12/19 0742  TempSrc: Oral  PainSc:                  [name]

## 2019-12-12 NOTE — Plan of Care (Signed)

## 2019-12-12 NOTE — Progress Notes (Signed)
PROGRESS NOTE    Melanie Hammond  SAY:[id number] DOB: 1950/05/04 DOA: 12/09/2019 PCP: Patient, No Pcp Per    Brief Narrative:  Melanie Hammond is a 69 y.o. female with medical history significant for diabetes mellitus, hypertension, nicotine dependence and history of mitral valve prolapse who was brought into the ER by EMS after she fell landing on her left side.  Patient was unable to get up after her fall due to severe pain.  Patient stated that she fell while walking up some stairs landing on her left side.  Left leg appears to be shortened and internally rotated.  She denies any head trauma loss of consciousness.  She denies feeling dizzy or lightheaded prior to the fall. She complains of shortness of breath but denies having any chest pain, no nausea, no vomiting, no diaphoresis or palpitations. She denies having any abdominal pain or any changes in her bowel habits. Labs show sodium of 132, potassium of 4, chloride 100, bicarb 23, BUN 7, creatinine 0.95, white count 6.8, hemoglobin 13.8, hematocrit 41.5, MCV 88.1, RDW 14.6, platelet count 142 Chest x-ray reviewed by me shows no obvious infiltrate or effusion X-ray of the left hip shows moderately displaced intertrochanteric fracture of the left hip Twelve-lead EKG reviewed by me shows sinus rhythm with ST depression in the lateral leads    Consultants:   Orthopedics  Procedures: Status post IM nailing of displaced subtrochanteric left hip fracture on 9/18 9/20-Open reduction and internal fixation of medial malleolar fracture, right ankle.   Antimicrobials:   Cefazolin x3 doses   Subjective: C/o of pain with movement, otherwise no complaints. +Bm.   Objective: Vitals:   12/11/19 2318 12/12/19 0408 12/12/19 0742 12/12/19 1047  BP: (!) 161/57 (!) 184/65 (!) 192/69 (!) 151/55  Pulse: 69 79 86 75  Resp: 17 16 18    Temp: 99 F (37.2 C) 98.9 F (37.2 C) 99 F (37.2 C)   TempSrc: Oral Oral Oral   SpO2: 99% 99% 99%   Weight:       Height:        Intake/Output Summary (Last 24 hours) at 12/12/2019 1508 Last data filed at 12/12/2019 1357 Gross per 24 hour  Intake 580 ml  Output 2405 ml  Net -1825 ml   Filed Weights   12/09/19 0935  Weight: 87.1 kg    Examination: Comfortable, NAD sitting in chair with legs elevated PT just left CTA, no wheeze rales rhonchi's RRR S1-S2 no murmurs rubs gallops Soft benign positive bowel sounds nontender nondistended Right lower extremity in boot, LLE no edema Awake and alert, grossly intact Mood and affect appropriate in current setting     Data Reviewed: I have personally reviewed following labs and imaging studies  CBC: Recent Labs  Lab 12/09/19 1115 12/10/19 0347 12/11/19 0624 12/12/19 0436  WBC 6.8 6.1 6.3 6.2  NEUTROABS 5.6  --   --   --   HGB 13.8 10.2* 9.7* 8.5*  HCT 41.5 30.8* 27.7* 25.7*  MCV 88.1 89.0 83.9 88.6  PLT 142* 158 143* 133*   Basic Metabolic Panel: Recent Labs  Lab 12/09/19 1115 12/10/19 0347 12/11/19 0624 12/12/19 0436  NA 132* 130* 133* 135  K 4.0 4.6 4.1 4.5  CL 100 98 101 102  CO2 23 26 24 27   GLUCOSE 169* 162* 172* 164*  BUN 7* 10 7* 9  CREATININE 0.95 1.03* 0.68 0.64  CALCIUM 9.5 8.6* 8.6* 8.9   GFR: Estimated Creatinine Clearance: 77.8 mL/min (by C-G formula based  on SCr of 0.64 mg/dL). Liver Function Tests: No results for input(s): AST, ALT, ALKPHOS, BILITOT, PROT, ALBUMIN in the last 168 hours. No results for input(s): LIPASE, AMYLASE in the last 168 hours. No results for input(s): AMMONIA in the last 168 hours. Coagulation Profile: No results for input(s): INR, PROTIME in the last 168 hours. Cardiac Enzymes: No results for input(s): CKTOTAL, CKMB, CKMBINDEX, TROPONINI in the last 168 hours. BNP (last 3 results) No results for input(s): PROBNP in the last 8760 hours. HbA1C: No results for input(s): HGBA1C in the last 72 hours. CBG: Recent Labs  Lab 12/11/19 1950 12/11/19 2320 12/12/19 0410 12/12/19 0742  12/12/19 1138  GLUCAP 260* 188* 149* 155* 214*   Lipid Profile: No results for input(s): CHOL, HDL, LDLCALC, TRIG, CHOLHDL, LDLDIRECT in the last 72 hours. Thyroid Function Tests: No results for input(s): TSH, T4TOTAL, FREET4, T3FREE, THYROIDAB in the last 72 hours. Anemia Panel: No results for input(s): VITAMINB12, FOLATE, FERRITIN, TIBC, IRON, RETICCTPCT in the last 72 hours. Sepsis Labs: No results for input(s): PROCALCITON, LATICACIDVEN in the last 168 hours.  Recent Results (from the past 240 hour(s))  SARS Coronavirus 2 by RT PCR (hospital order, performed in [facility] hospital lab) Nasopharyngeal Nasopharyngeal Swab     Status: None   Collection Time: 12/09/19 10:34 AM   Specimen: Nasopharyngeal Swab  Result Value Ref Range Status   SARS Coronavirus 2 NEGATIVE NEGATIVE Final    Comment: (NOTE) SARS-CoV-2 target nucleic acids are NOT DETECTED.  The SARS-CoV-2 RNA is generally detectable in upper and lower respiratory specimens during the acute phase of infection. The lowest concentration of SARS-CoV-2 viral copies this assay can detect is 250 copies / mL. A negative result does not preclude SARS-CoV-2 infection and should not be used as the sole basis for treatment or other patient management decisions.  A negative result may occur with improper specimen collection / handling, submission of specimen other than nasopharyngeal swab, presence of viral mutation(s) within the areas targeted by this assay, and inadequate number of viral copies (<250 copies / mL). A negative result must be combined with clinical observations, patient history, and epidemiological information.  Fact Sheet for Patients:   BoilerBrush.com.cy  Fact Sheet for Healthcare Providers: https://pope.com/  This test is not yet approved or  cleared by the [place] FDA and has been authorized for detection and/or diagnosis of SARS-CoV-2 by FDA under an  Emergency Use Authorization (EUA).  This EUA will remain in effect (meaning this test can be used) for the duration of the COVID-19 declaration under Section 564(b)(1) of the Act, 21 U.S.C. section 360bbb-3(b)(1), unless the authorization is terminated or revoked sooner.  Performed at [facility], [address], [place], [place] [address]          Radiology Studies: DG Ankle 2 Views Right  Result Date: 12/11/2019 CLINICAL DATA:  Right ankle surgery EXAM: RIGHT ANKLE - 2 VIEW COMPARISON:  12/09/2019 FINDINGS: The patient has undergone ORIF of the right ankle with 2 cannulated screws coursing through the medial malleolus. The alignment appears near anatomic. The hardware is intact. There are expected postsurgical changes. IMPRESSION: Status post ORIF of the right ankle with expected postsurgical changes. Electronically Signed   By: Katherine Mantle M.D.   On: 12/11/2019 15:02   DG C-Arm 1-60 Min  Result Date: 12/11/2019 CLINICAL DATA:  Ankle surgery EXAM: DG C-ARM 1-60 MIN FLUOROSCOPY TIME:  Fluoroscopy Time:  35 seconds Number of Acquired Spot Images: 5 COMPARISON:  12/09/2019  FINDINGS: The patient has undergone ORIF of the medial malleolus. There are 2 cannulated screws, both of which appear well positioned and are intact. There are expected postsurgical changes. IMPRESSION: Status post ORIF of the medial malleolus. Electronically Signed   By: Katherine Mantlehristopher  Green M.D.   On: 12/11/2019 15:03        Scheduled Meds: . amitriptyline  100 mg Oral QHS  . buPROPion  150 mg Oral Daily  . busPIRone  15 mg Oral BID  . docusate sodium  100 mg Oral BID  . donepezil  5 mg Oral QPM  . dorzolamidel-timolol  1 drop Left Eye BID  . insulin aspart  0-15 Units Subcutaneous Q4H  . irbesartan  300 mg Oral Daily  . latanoprost  1 drop Left Eye QHS  . levothyroxine  50 mcg Oral Q0600  . metoprolol tartrate  25 mg Oral BID  . nicotine  14 mg Transdermal Daily  . pantoprazole  40 mg  Oral Daily  . promethazine  12.5 mg Intravenous Once  . Ensure Max Protein  11 oz Oral Daily  . rivaroxaban  20 mg Oral Daily  . sertraline  50 mg Oral Daily  . traZODone  100 mg Oral QHS  . verapamil  240 mg Oral Daily   Continuous Infusions: . methocarbamol (ROBAXIN) IV      Assessment & Plan:   Principal Problem:   Intertrochanteric fracture of left hip (HCC) Active Problems:   Diabetes mellitus (HCC)   Nicotine dependence   Essential hypertension   Obesity (BMI 30-39.9)   Fall   Intertrochanteric fracture of left hip-status post mechanical fall Left hip x-ray revealed moderately displaced intertrochanteric fracture of the left hip Ortho following will follow up on any new recommendations Status post IM nailing of displaced subtrochanteric left hip fracture 9/18 PT recommended SNF Continue pain regimen Bowel regimen    Right Ankle pain-patient was having right ankle pain yesterday unable to put weight bearing with PT.  She complained of this after her hip surgery.   -Ankle x-ray revealed displaced fracture of the right medial malleolus and a probable nondisplaced fracture of the lateral malleolus Status post open reduction internal fixation medial malleolus fracture right on 9/20 Currently wearing a boot PT recommended SNF  Hyponatremia- Sodium had dropped to 130 on 9/19.  Likely from dehydration  Improved with IV fluids, currently 135  Continue to monitor periodically    Drop in H/H - 2/2 recent surgery. Hg 8.5 today. Continue to monitor  Transfuse if Hg <7   Nicotine dependence Patient smokes 1/2 pack of cigarettes daily Counseled on smoking cessation  On nicotine patch    Essential hypertension Currently uncontrolled, partly also due to pain Pain control Will start home ARB Monitor bp closely  Diabetes mellitus Overall blood glucose levels are stable  RISS Hypoglycemic protocol  Obesity Complicates overall prognosis and care   DVT  prophylaxis: Xarelto Code Status: Full Family Communication: Left daughter voice message that I called Status is: Inpatient  Remains inpatient appropriate because of severe pain and safe d/c planning  Dispo: The patient is from: Home              Anticipated d/c is to: SNF              Anticipated d/c date is: 1              Patient currently medically unstable, needs good pain control still requiring IV pain meds  and safe d/c  planning -SNF pending.            LOS: 3 days   Time spent:35 min with >50% on coc    Lynn Ito, MD [facility] Pager [phone]  If 7PM-7AM, please contact night-coverage [email or web address] Password [facility] 12/12/2019, 3:08 PM

## 2019-12-12 NOTE — Progress Notes (Signed)
Pt sitting in bed eating breakfast. Pt pain 5/10, PRN meds given. Scheduled meds given. Pt denies nay further needs at this time. Call bell within reach, RN number on board, Pt denies need for bathroom, bed alarm on.

## 2019-12-12 NOTE — Evaluation (Signed)
Physical Therapy Re-Evaluation Patient Details Name: Melanie Hammond MRN: [id number] DOB: [birth year] Today's Date: 12/12/2019   History of Present Illness  Malayzia Laforte is a 69 y.o. female with medical history significant for diabetes mellitus, hypertension, nicotine dependence and history of mitral valve prolapse who was brought into the ER by EMS after she fell on stairs landing on her left side.  Patient was unable to get up after her fall due to severe pain.  Left femur fracture with ORIF IM nailing, then ORIF of the L ankle fracture 9/20.  Clinical Impression  Pt showed good effort but is highly pain with weakness/pain L more limited with R.  She was able to ambulate a few feet with highly walker reliant and labored effort (CAM boot donned on R).  Pt tolerated exercises reasonably well, but L hip pain is big limiter for functional mobility.      Follow Up Recommendations SNF    Equipment Recommendations  Rolling walker with 5" wheels    Recommendations for Other Services       Precautions / Restrictions Precautions Precautions: Fall Restrictions RLE Weight Bearing: Weight bearing as tolerated (with CAM walker boot) LLE Weight Bearing: Weight bearing as tolerated      Mobility  Bed Mobility Overal bed mobility: Needs Assistance Bed Mobility: Supine to Sit     Supine to sit: Mod assist     General bed mobility comments: Pt struggled to move L LE toward EOB, unable to elevate torso/get to sitting w/o mod assist  Transfers Overall transfer level: Needs assistance Equipment used: Rolling walker (2 wheeled) Transfers: Sit to/from Stand Sit to Stand: +2 physical assistance;Min assist;Mod assist;From elevated surface         General transfer comment: elevated bed ~4", +2 assist to attain standing, highly walker/UE reliant  Ambulation/Gait Ambulation/Gait assistance: Min assist;+2 physical assistance Gait Distance (Feet): 8 Feet Assistive device: Rolling walker (2 wheeled)        General Gait Details: very UE reliant ambulation but hesitancy due to pain and lack of confidence.  Pt fatigued quickly but did show good effort with taking small, hesitant steps.  Pt quick to fatigue and showed poor relative tolerance despite b/l WBAT (R CAM boot donned)  Stairs            Wheelchair Mobility    Modified Rankin (Stroke Patients Only)       Balance Overall balance assessment: Needs assistance Sitting-balance support: Bilateral upper extremity supported Sitting balance-Leahy Scale: Good     Standing balance support: Bilateral upper extremity supported Standing balance-Leahy Scale: Poor Standing balance comment: highly reliant on walker/UEs, no LOBs but very guarded                             Pertinent Vitals/Pain Pain Assessment: 0-10 Pain Score: 8  Pain Location: L thigh/hip, less in R ankle    Home Living Family/patient expects to be discharged to:: Skilled nursing facility Living Arrangements: Children Available Help at Discharge: Family   Home Access: Stairs to enter   Secretary/administrator of Steps: 6 Home Layout: One level        Prior Function Level of Independence: Independent with assistive device(s)               Hand Dominance        Extremity/Trunk Assessment   Upper Extremity Assessment Upper Extremity Assessment: Overall WFL for tasks assessed    Lower Extremity Assessment Lower  Extremity Assessment: Generalized weakness (poor tolerance to any L hip/knee AROM, R hip/knee 4-/5)       Communication   Communication: No difficulties  Cognition Arousal/Alertness: Awake/alert Behavior During Therapy: WFL for tasks assessed/performed Overall Cognitive Status: Within Functional Limits for tasks assessed                                        General Comments      Exercises General Exercises - Lower Extremity Ankle Circles/Pumps: Left;Strengthening;10 reps Quad Sets:  Strengthening;10 reps Short Arc Quad: AROM;10 reps Heel Slides: Strengthening;10 reps;AAROM (resisted leg ext b/l, AAROM on L, AROM/strenghtening on R) Hip ABduction/ADduction: AAROM;Strengthening;10 reps (AAROM on L, AROM on R)   Assessment/Plan    PT Assessment Patient needs continued PT services  PT Problem List Decreased strength;Decreased activity tolerance;Decreased range of motion;Decreased balance;Decreased mobility;Decreased coordination;Decreased cognition;Decreased knowledge of use of DME;Decreased knowledge of precautions;Decreased safety awareness;Pain       PT Treatment Interventions Gait training;Stair training;Therapeutic activities;Therapeutic exercise;Functional mobility training;Balance training;Patient/family education    PT Goals (Current goals can be found in the Care Plan section)  Acute Rehab PT Goals Patient Stated Goal: get back to walking well PT Goal Formulation: With patient Time For Goal Achievement: 01/07/20 Potential to Achieve Goals: Fair    Frequency BID   Barriers to discharge        Co-evaluation               AM-PAC PT "6 Clicks" Mobility  Outcome Measure Help needed turning from your back to your side while in a flat bed without using bedrails?: A Lot Help needed moving from lying on your back to sitting on the side of a flat bed without using bedrails?: A Lot Help needed moving to and from a bed to a chair (including a wheelchair)?: A Lot Help needed standing up from a chair using your arms (e.g., wheelchair or bedside chair)?: A Lot Help needed to walk in hospital room?: A Lot Help needed climbing 3-5 steps with a railing? : Total 6 Click Score: 11    End of Session Equipment Utilized During Treatment: Gait belt Activity Tolerance: Patient limited by pain Patient left: with call bell/phone within reach;in chair Nurse Communication: Mobility status PT Visit Diagnosis: History of falling (Z91.81);Difficulty in walking, not  elsewhere classified (R26.2);Unsteadiness on feet (R26.81);Other abnormalities of gait and mobility (R26.89);Pain Pain - Right/Left: Left Pain - part of body: Hip    Time: 6789-3810 PT Time Calculation (min) (ACUTE ONLY): 47 min   Charges:   PT Evaluation $PT Re-evaluation: 1 Re-eval PT Treatments $Gait Training: 8-22 mins $Therapeutic Exercise: 8-22 mins        Malachi Pro, DPT 12/12/2019, 11:33 AM

## 2019-12-12 NOTE — TOC Progression Note (Signed)
Transition of Care [facility]) - Progression Note    Patient Details  Name: Melanie Hammond MRN: [id number] Date of Birth: 16-Jun-1950  [facility] CM/SW Contact  Trenton Founds, RN Phone Number: 12/12/2019, 3:45 PM  Clinical Narrative:   RNCM reached out to patient's daughter per her request. Amber reports that she has had a seizure a little earlier in the day and doesn't really feel well enough to talk, she requests to be called back tomorrow. RNCM will follow up tomorrow am.     Expected Discharge Plan: Skilled Nursing Facility Barriers to Discharge: Continued Medical Work up  Expected Discharge Plan and Services Expected Discharge Plan: Skilled Nursing Facility       Living arrangements for the past 2 months: Single Family Home                                       Social Determinants of Health (SDOH) Interventions    Readmission Risk Interventions No flowsheet data found.

## 2019-12-13 DIAGNOSIS — D62 Acute posthemorrhagic anemia: Secondary | ICD-10-CM

## 2019-12-13 LAB — BASIC METABOLIC PANEL
Anion gap: 8 (ref 5–15)
BUN: 14 mg/dL (ref 8–23)
CO2: 24 mmol/L (ref 22–32)
Calcium: 9.2 mg/dL (ref 8.9–10.3)
Chloride: 101 mmol/L (ref 98–111)
Creatinine, Ser: 0.8 mg/dL (ref 0.44–1.00)
GFR calc Af Amer: >60 mL/min (ref >60)
GFR calc non Af Amer: >60 mL/min (ref >60)
Glucose, Bld: 113 mg/dL — ABNORMAL HIGH (ref 70–99)
Potassium: 4 mmol/L (ref 3.5–5.1)
Sodium: 133 mmol/L — ABNORMAL LOW (ref 135–145)

## 2019-12-13 LAB — CBC
HCT: 24.6 % — ABNORMAL LOW (ref 36.0–46.0)
Hemoglobin: 7.9 g/dL — ABNORMAL LOW (ref 12.0–15.0)
MCH: 29 pg (ref 26.0–34.0)
MCHC: 32.1 g/dL (ref 30.0–36.0)
MCV: 90.4 fL (ref 80.0–100.0)
Platelets: 150 10*3/uL (ref 150–400)
RBC: 2.72 MIL/uL — ABNORMAL LOW (ref 3.87–5.11)
RDW: 14.9 % (ref 11.5–15.5)
WBC: 5 10*3/uL (ref 4.0–10.5)
nRBC: 0 % (ref 0.0–0.2)

## 2019-12-13 LAB — GLUCOSE, CAPILLARY
Glucose-Capillary: 112 mg/dL — ABNORMAL HIGH (ref 70–99)
Glucose-Capillary: 115 mg/dL — ABNORMAL HIGH (ref 70–99)
Glucose-Capillary: 180 mg/dL — ABNORMAL HIGH (ref 70–99)
Glucose-Capillary: 156 mg/dL — ABNORMAL HIGH (ref 70–99)
Glucose-Capillary: 180 mg/dL — ABNORMAL HIGH (ref 70–99)

## 2019-12-13 MED ORDER — TRAMADOL HCL 50 MG PO TABS
50.0000 mg | ORAL_TABLET | Freq: Four times a day (QID) | ORAL | 0 refills | Status: DC | PRN
Start: 2019-12-13 — End: 2022-03-02

## 2019-12-13 MED ORDER — OXYCODONE HCL 5 MG PO TABS: 5.0000 mg | ORAL_TABLET | ORAL | 0 refills | Status: DC | PRN

## 2019-12-13 MED ORDER — DORZOLAMIDE HCL-TIMOLOL MAL 2-0.5 % OP SOLN
1.0000 [drp] | Freq: Two times a day (BID) | OPHTHALMIC | Status: DC
  Administered 2019-12-13 – 2019-12-14 (×2): 1 [drp] via OPHTHALMIC
  Filled 2019-12-13: qty 10

## 2019-12-13 NOTE — Progress Notes (Signed)
  Subjective: 2 Days Post-Op Procedure(s) (LRB): OPEN REDUCTION INTERNAL FIXATION (ORIF) ANKLE FRACTURE (Right)  POD4 Reduction and internal fixation of left intertrochanteric hip fracture Patient reports pain as moderate in both the left hip and right ankle. Patient is well, and has had no acute complaints or problems Plan is to go to SNF when medically stable. Negative for chest pain and shortness of breath Fever: no Gastrointestinal:Negative for nausea and vomiting Patient has not had a BM at this time.  Objective: Vital signs in last 24 hours: Temp:  [98.4 F (36.9 C)-98.5 F (36.9 C)] 98.4 F (36.9 C) (09/22 0741) Pulse Rate:  [63-75] 64 (09/22 0741) Resp:  [17-18] 18 (09/22 0741) BP: (137-159)/(53-59) 159/53 (09/22 0741) SpO2:  [97 %-98 %] 97 % (09/22 0741)  Intake/Output from previous day:  Intake/Output Summary (Last 24 hours) at 12/13/2019 0748 Last data filed at 12/13/2019 0202 Gross per 24 hour  Intake 480 ml  Output 2000 ml  Net -1520 ml    Intake/Output this shift: No intake/output data recorded.  Labs: Recent Labs    12/11/19 0624 12/12/19 0436 12/13/19 0450  HGB 9.7* 8.5* 7.9*   Recent Labs    12/12/19 0436 12/13/19 0450  WBC 6.2 5.0  RBC 2.90* 2.72*  HCT 25.7* 24.6*  PLT 133* 150   Recent Labs    12/12/19 0436 12/13/19 0450  NA 135 133*  K 4.5 4.0  CL 102 101  CO2 27 24  BUN 9 14  CREATININE 0.64 0.80  GLUCOSE 164* 113*  CALCIUM 8.9 9.2   No results for input(s): LABPT, INR in the last 72 hours.   EXAM General - Patient is Alert, Appropriate and Oriented Extremity - CAM walker boot intact to the right foot. Patient is intact to light touch over the dorsal and volar aspect of bilateral feet, dorsiflexion and plantarflexion intact. Cap refill intact bilaterally. Intact dorsalis pedis pulses. Moderate ecchymosis to the left posterior thigh, no drainage to the honeycomb dressings. Dressing/Incision - clean, dry, no drainage Motor  Function - intact, moving foot and toes well on exam.  Abdomen soft with normal bowel sounds, mild distention.  Past Medical History:  Diagnosis Date  . Diabetes mellitus without complication (HCC)   . Hypertension     Assessment/Plan: 2 Days Post-Op Procedure(s) (LRB): OPEN REDUCTION INTERNAL FIXATION (ORIF) ANKLE FRACTURE (Right) Principal Problem:   Intertrochanteric fracture of left hip (HCC) Active Problems:   Diabetes mellitus (HCC)   Nicotine dependence   Essential hypertension   Obesity (BMI 30-39.9)   Fall  Estimated body mass index is 29.19 kg/m as calculated from the following:   Height as of this encounter: 5\' 8"  (1.727 m).   Weight as of this encounter: 87.1 kg. Advance diet Up with therapy D/C IV fluids  When tolerating po intake.  Labs reviewed this AM. Hg 7.9, denies any SOB or dizziness. Na 133, encouraged increased oral intake. Continue with PT today, currently plan is for d/c to SNF when able. Patient has not had a BM yet, move on to FLEET enema today.  Upon discharge, follow-up with [facility] in 14 days for staple removal.  DVT Prophylaxis - Xarelto and TED hose Weight-Bearing as tolerated to bilateral legs, must be in CAM boot to weightbear to the right leg.  , PA-C [facility]opa[facility] 12/13/2019, 7:48 AM

## 2019-12-13 NOTE — Progress Notes (Signed)
Physical Therapy Treatment Patient Details Name: Melanie Hammond MRN: [id number] DOB: 1950/05/17 Today's Date: 12/13/2019    History of Present Illness Melanie Hammond is a 69 y.o. female with medical history significant for diabetes mellitus, hypertension, nicotine dependence and history of mitral valve prolapse who was brought into the ER by EMS after she fell on stairs landing on her left side.  Patient was unable to get up after her fall due to severe pain.  Left femur fracture with ORIF IM nailing, then ORIF of the L ankle fracture 9/20.    PT Comments    Pt was sitting in recliner upon arriving. She agrees to PT session but requested not to ambulate 2/2 to pain. Pt has been limited with PT progress due to pain. Endorses 6/10 pain. She required increased assistance to stand from lower recliner surface with mod-max to achieve full upright standing posture. She took ~ 3 steps to EOB and then required max assist to return to supine. Pt is very slow moving 2/2 to pain. Recommend DC to SNF to address deficits and to assist pt to PLOF.      Follow Up Recommendations  SNF     Equipment Recommendations  Other (comment) (defer to next level of care)    Recommendations for Other Services       Precautions / Restrictions Precautions Precautions: Fall Restrictions Weight Bearing Restrictions: Yes RLE Weight Bearing: Weight bearing as tolerated LLE Weight Bearing: Weight bearing as tolerated    Mobility  Bed Mobility Overal bed mobility: Needs Assistance Bed Mobility: Sit to Supine       Sit to supine: Max assist   General bed mobility comments: Max assist 2/2 to pain. unable to progress BLEs into bed without max vcs and assistance. increased time 2/2 to pain.  Transfers Overall transfer level: Needs assistance Equipment used: Rolling walker (2 wheeled) Transfers: Sit to/from Stand Sit to Stand: Mod assist;Max assist         General transfer comment: pt required increased assistance  to stand form lower recliner height. vcs for fwd wt shift, hand placement, and increased time 2/2 to pain  Ambulation/Gait Ambulation/Gait assistance: Min assist Gait Distance (Feet): 3 Feet Assistive device: Rolling walker (2 wheeled) Gait Pattern/deviations: Antalgic Gait velocity:  extremely decreased   General Gait Details: pt was unwilling to trial ambulation 2/2 to pain. Only wanted to get back into bed       Balance Overall balance assessment: Needs assistance Sitting-balance support: Bilateral upper extremity supported Sitting balance-Leahy Scale: Good Sitting balance - Comments: no LOB in sitting with feert support and BUE support         Cognition Arousal/Alertness: Awake/alert Behavior During Therapy: WFL for tasks assessed/performed Overall Cognitive Status: Within Functional Limits for tasks assessed      General Comments: Pt is A and O x 4. C/o ankle brace discomfort. adjusted in sitting      Exercises Other Exercises Other Exercises: Therapist issued HEP general LE handout however pt did not want to perform. therapist demonstarted and pt states she will try to perform when pain decreases        Pertinent Vitals/Pain Pain Assessment: 0-10 Pain Score: 6  Faces Pain Scale: Hurts little more Pain Location: L thigh/hip, R ankle Pain Descriptors / Indicators: Operative site guarding;Sharp Pain Intervention(s): Limited activity within patient's tolerance;Monitored during session;Repositioned           PT Goals (current goals can now be found in the care plan section) Acute Rehab  PT Goals Patient Stated Goal: to return to PLOF Progress towards PT goals: Progressing toward goals    Frequency    BID      PT Plan Current plan remains appropriate       AM-PAC PT "6 Clicks" Mobility   Outcome Measure  Help needed turning from your back to your side while in a flat bed without using bedrails?: A Little Help needed moving from lying on your back to  sitting on the side of a flat bed without using bedrails?: A Little Help needed moving to and from a bed to a chair (including a wheelchair)?: A Lot Help needed standing up from a chair using your arms (e.g., wheelchair or bedside chair)?: A Lot Help needed to walk in hospital room?: A Lot Help needed climbing 3-5 steps with a railing? : A Lot 6 Click Score: 14    End of Session Equipment Utilized During Treatment: Gait belt Activity Tolerance: Patient limited by pain Patient left: in bed;with call bell/phone within reach;with bed alarm set Nurse Communication: Mobility status PT Visit Diagnosis: History of falling (Z91.81);Difficulty in walking, not elsewhere classified (R26.2);Unsteadiness on feet (R26.81);Other abnormalities of gait and mobility (R26.89);Pain Pain - Right/Left: Left Pain - part of body: Hip     Time: 5035-4656 PT Time Calculation (min) (ACUTE ONLY): 22 min  Charges:  $Therapeutic Activity: 8-22 mins                     Jetta Lout PTA 12/13/19, 4:26 PM

## 2019-12-13 NOTE — Plan of Care (Signed)

## 2019-12-13 NOTE — Progress Notes (Signed)
Physical Therapy Treatment Patient Details Name: Melanie Hammond MRN: [id number] DOB: 04-12-1950 Today's Date: 12/13/2019    History of Present Illness Melanie Hammond is a 69 y.o. female with medical history significant for diabetes mellitus, hypertension, nicotine dependence and history of mitral valve prolapse who was brought into the ER by EMS after she fell on stairs landing on her left side.  Patient was unable to get up after her fall due to severe pain.  Left femur fracture with ORIF IM nailing, then ORIF of the L ankle fracture 9/20.    PT Comments    Pt was agreeable to PT session now that she has been premedicated. Reports 7/10 pain in BLEs but was willing to participate. Was able to exit L side of bed with mod assist + increased time. Limited throughout session by pain. She stood to RW with vcs + mod assist. Ambulated 15 ft around bed with very slow antalgic gait kinematics. Was repositioned in recliner at conclusion of session with call bell in reach and chair alarm in place. Will return later this date per POC. Recommend DC to SNF to address deficits and assist pt to PLOF.      Follow Up Recommendations  SNF     Equipment Recommendations  Other (comment) (defer to next elvel of care)    Recommendations for Other Services       Precautions / Restrictions Precautions Precautions: Fall Restrictions Weight Bearing Restrictions: Yes RLE Weight Bearing: Weight bearing as tolerated LLE Weight Bearing: Weight bearing as tolerated    Mobility  Bed Mobility Overal bed mobility: Needs Assistance Bed Mobility: Supine to Sit     Supine to sit: Mod assist     General bed mobility comments: Increased time + mod assist to exit L side of bed.  Transfers Overall transfer level: Needs assistance Equipment used: Rolling walker (2 wheeled) Transfers: Sit to/from Stand Sit to Stand: Mod assist;From elevated surface         General transfer comment: Pt was able to STS from elevated  EOB surface with mod assist + vcs for technique, seqencing, and safety.   Ambulation/Gait Ambulation/Gait assistance: Min guard Gait Distance (Feet): 15 Feet Assistive device: Rolling walker (2 wheeled) Gait Pattern/deviations: Antalgic;Narrow base of support;Trunk flexed;Step-to pattern Gait velocity:  extremely decreased   General Gait Details: Pt was able to ambulate from L side of bed, around bed to recliner ~ 15 ft. Very antalgic, slow, step to gait pattern.     Balance Overall balance assessment: Needs assistance Sitting-balance support: Bilateral upper extremity supported Sitting balance-Leahy Scale: Good Sitting balance - Comments: no LOB in sitting with feert support and BUE support       Cognition Arousal/Alertness: Awake/alert Behavior During Therapy: WFL for tasks assessed/performed Overall Cognitive Status: Within Functional Limits for tasks assessed        General Comments: Pt was agreeable to PT session after receiving pain meds ~ 30 minutes prior. Limited by pain throughout.              Pertinent Vitals/Pain Pain Assessment: 0-10 Pain Score: 7  Faces Pain Scale: Hurts even more Pain Location: L thigh/hip, R ankle Pain Descriptors / Indicators: Operative site guarding;Sharp Pain Intervention(s): Limited activity within patient's tolerance;Monitored during session;Premedicated before session;Repositioned           PT Goals (current goals can now be found in the care plan section) Acute Rehab PT Goals Patient Stated Goal: get back to walking well Progress towards PT goals: Progressing  toward goals    Frequency    BID      PT Plan Current plan remains appropriate       AM-PAC PT "6 Clicks" Mobility   Outcome Measure  Help needed turning from your back to your side while in a flat bed without using bedrails?: A Little Help needed moving from lying on your back to sitting on the side of a flat bed without using bedrails?: A Little Help  needed moving to and from a bed to a chair (including a wheelchair)?: A Lot Help needed standing up from a chair using your arms (e.g., wheelchair or bedside chair)?: A Lot Help needed to walk in hospital room?: A Lot Help needed climbing 3-5 steps with a railing? : A Lot 6 Click Score: 14    End of Session Equipment Utilized During Treatment: Gait belt Activity Tolerance: Patient limited by pain Patient left: in chair;with call bell/phone within reach;with chair alarm set;with SCD's reapplied Nurse Communication: Mobility status PT Visit Diagnosis: History of falling (Z91.81);Difficulty in walking, not elsewhere classified (R26.2);Unsteadiness on feet (R26.81);Other abnormalities of gait and mobility (R26.89);Pain Pain - Right/Left: Left Pain - part of body: Hip     Time: 8144-8185 PT Time Calculation (min) (ACUTE ONLY): 27 min  Charges:  $Gait Training: 8-22 mins $Therapeutic Activity: 8-22 mins                     Jetta Lout PTA 12/13/19, 12:45 PM

## 2019-12-13 NOTE — Progress Notes (Addendum)
PROGRESS NOTE    Melanie Hammond  JKD:[id number] DOB: [birth year] DOA: 12/09/2019 PCP: Patient, No Pcp Per  Assessment & Plan:   Principal Problem:   Intertrochanteric fracture of left hip (HCC) Active Problems:   Diabetes mellitus (HCC)   Nicotine dependence   Essential hypertension   Obesity (BMI 30-39.9)   Fall   Right ankle fracture: secondary to fall. S/p open reduction & internal fixation of medial malleolar fracture, right ankle on 12/11/19 as per ortho surgery.   Left hip fracture: secondary to fall. s/p proximal fracture w/ IM nail fixation.  Hyponatremia: improving w/ IVFs. Will continue to monitor   Acute blood loss anemia: likely secondary to recent surgery. Will transfuse if Hb <7.0  Smoker: smoking cessation counseling. Nicotine patch to prevent w/drawal   HTN: continue on home dose of irbesartan   DM2: well controlled. Continue on SSI w/ accuchecks   Hypothyroidism: will continue on home dose of levothyroxine   Overweight: BMI 29.1. Would benefit from weight loss    DVT prophylaxis: xarelto Code Status: full  Family Communication:  Disposition Plan: likely d/c to SNF   Status is: Inpatient  Remains inpatient appropriate because:Unsafe d/c plan, awaiting SNF placement    Dispo: The patient is from: Home              Anticipated d/c is to: SNF              Anticipated d/c date is: 3 days              Patient currently is not medically stable to d/c.    Consultants:   Ortho surg   Procedures:   Antimicrobials  Subjective: Pt c/o fatigue   Objective: Vitals:   12/12/19 1536 12/12/19 2121 12/12/19 2316 12/13/19 0741  BP: (!) 150/56 (!) 156/57 (!) 137/59 (!) 159/53  Pulse: 74 67 63 64  Resp: 17  18 18   Temp: 98.4 F (36.9 C)  98.5 F (36.9 C) 98.4 F (36.9 C)  TempSrc:   Oral Oral  SpO2: 98% 97% 98% 97%  Weight:      Height:        Intake/Output Summary (Last 24 hours) at 12/13/2019 0828 Last data filed at 12/13/2019 0202 Gross  per 24 hour  Intake 480 ml  Output 2000 ml  Net -1520 ml   Filed Weights   12/09/19 0935  Weight: 87.1 kg    Examination:  General exam: Appears calm and comfortable  Respiratory system: Clear to auscultation. Respiratory effort normal. No rales, rhonchi Cardiovascular system: S1 & S2+. No rubs, gallops or clicks. Gastrointestinal system: Abdomen is nondistended, soft and nontender.  Normal bowel sounds heard. Central nervous system: Alert and oriented. Moves all 4 extremities  Psychiatry: Judgement and insight appear normal. Flat mood and affect.     Data Reviewed: I have personally reviewed following labs and imaging studies  CBC: Recent Labs  Lab 12/09/19 1115 12/10/19 0347 12/11/19 0624 12/12/19 0436 12/13/19 0450  WBC 6.8 6.1 6.3 6.2 5.0  NEUTROABS 5.6  --   --   --   --   HGB 13.8 10.2* 9.7* 8.5* 7.9*  HCT 41.5 30.8* 27.7* 25.7* 24.6*  MCV 88.1 89.0 83.9 88.6 90.4  PLT 142* 158 143* 133* 150   Basic Metabolic Panel: Recent Labs  Lab 12/09/19 1115 12/10/19 0347 12/11/19 0624 12/12/19 0436 12/13/19 0450  NA 132* 130* 133* 135 133*  K 4.0 4.6 4.1 4.5 4.0  CL 100 98 101 102  101  CO2 23 26 24 27 24   GLUCOSE 169* 162* 172* 164* 113*  BUN 7* 10 7* 9 14  CREATININE 0.95 1.03* 0.68 0.64 0.80  CALCIUM 9.5 8.6* 8.6* 8.9 9.2   GFR: Estimated Creatinine Clearance: 77.8 mL/min (by C-G formula based on SCr of 0.8 mg/dL). Liver Function Tests: No results for input(s): AST, ALT, ALKPHOS, BILITOT, PROT, ALBUMIN in the last 168 hours. No results for input(s): LIPASE, AMYLASE in the last 168 hours. No results for input(s): AMMONIA in the last 168 hours. Coagulation Profile: No results for input(s): INR, PROTIME in the last 168 hours. Cardiac Enzymes: No results for input(s): CKTOTAL, CKMB, CKMBINDEX, TROPONINI in the last 168 hours. BNP (last 3 results) No results for input(s): PROBNP in the last 8760 hours. HbA1C: No results for input(s): HGBA1C in the last 72  hours. CBG: Recent Labs  Lab 12/12/19 1632 12/12/19 1948 12/12/19 2320 12/13/19 0359 12/13/19 0742  GLUCAP 154* 157* 165* 112* 115*   Lipid Profile: No results for input(s): CHOL, HDL, LDLCALC, TRIG, CHOLHDL, LDLDIRECT in the last 72 hours. Thyroid Function Tests: No results for input(s): TSH, T4TOTAL, FREET4, T3FREE, THYROIDAB in the last 72 hours. Anemia Panel: No results for input(s): VITAMINB12, FOLATE, FERRITIN, TIBC, IRON, RETICCTPCT in the last 72 hours. Sepsis Labs: No results for input(s): PROCALCITON, LATICACIDVEN in the last 168 hours.  Recent Results (from the past 240 hour(s))  SARS Coronavirus 2 by RT PCR (hospital order, performed in [facility] hospital lab) Nasopharyngeal Nasopharyngeal Swab     Status: None   Collection Time: 12/09/19 10:34 AM   Specimen: Nasopharyngeal Swab  Result Value Ref Range Status   SARS Coronavirus 2 NEGATIVE NEGATIVE Final    Comment: (NOTE) SARS-CoV-2 target nucleic acids are NOT DETECTED.  The SARS-CoV-2 RNA is generally detectable in upper and lower respiratory specimens during the acute phase of infection. The lowest concentration of SARS-CoV-2 viral copies this assay can detect is 250 copies / mL. A negative result does not preclude SARS-CoV-2 infection and should not be used as the sole basis for treatment or other patient management decisions.  A negative result may occur with improper specimen collection / handling, submission of specimen other than nasopharyngeal swab, presence of viral mutation(s) within the areas targeted by this assay, and inadequate number of viral copies (<250 copies / mL). A negative result must be combined with clinical observations, patient history, and epidemiological information.  Fact Sheet for Patients:   12/11/19  Fact Sheet for Healthcare Providers: BoilerBrush.com.cy  This test is not yet approved or  cleared by the https://pope.com/  FDA and has been authorized for detection and/or diagnosis of SARS-CoV-2 by FDA under an Emergency Use Authorization (EUA).  This EUA will remain in effect (meaning this test can be used) for the duration of the COVID-19 declaration under Section 564(b)(1) of the Act, 21 U.S.C. section 360bbb-3(b)(1), unless the authorization is terminated or revoked sooner.  Performed at [facility], [address], [place], [place]          Radiology Studies: DG Ankle 2 Views Right  Result Date: 12/11/2019 CLINICAL DATA:  Right ankle surgery EXAM: RIGHT ANKLE - 2 VIEW COMPARISON:  12/09/2019 FINDINGS: The patient has undergone ORIF of the right ankle with 2 cannulated screws coursing through the medial malleolus. The alignment appears near anatomic. The hardware is intact. There are expected postsurgical changes. IMPRESSION: Status post ORIF of the right ankle with expected postsurgical changes. Electronically Signed  By: Katherine Mantle M.D.   On: 12/11/2019 15:02   DG C-Arm 1-60 Min  Result Date: 12/11/2019 CLINICAL DATA:  Ankle surgery EXAM: DG C-ARM 1-60 MIN FLUOROSCOPY TIME:  Fluoroscopy Time:  35 seconds Number of Acquired Spot Images: 5 COMPARISON:  12/09/2019 FINDINGS: The patient has undergone ORIF of the medial malleolus. There are 2 cannulated screws, both of which appear well positioned and are intact. There are expected postsurgical changes. IMPRESSION: Status post ORIF of the medial malleolus. Electronically Signed   By: Katherine Mantle M.D.   On: 12/11/2019 15:03        Scheduled Meds: . amitriptyline  100 mg Oral QHS  . buPROPion  150 mg Oral Daily  . busPIRone  15 mg Oral BID  . docusate sodium  100 mg Oral BID  . donepezil  5 mg Oral QPM  . dorzolamidel-timolol  1 drop Left Eye BID  . insulin aspart  0-15 Units Subcutaneous Q4H  . irbesartan  300 mg Oral Daily  . latanoprost  1 drop Left Eye QHS  . levothyroxine  50 mcg Oral Q0600  . metoprolol  tartrate  25 mg Oral BID  . nicotine  14 mg Transdermal Daily  . pantoprazole  40 mg Oral Daily  . promethazine  12.5 mg Intravenous Once  . Ensure Max Protein  11 oz Oral Daily  . rivaroxaban  20 mg Oral Daily  . sertraline  50 mg Oral Daily  . traZODone  100 mg Oral QHS  . verapamil  240 mg Oral Daily   Continuous Infusions: . methocarbamol (ROBAXIN) IV       LOS: 4 days    Time spent: 31 mins     Charise Killian, MD [facility] Pager [phone]  If 7PM-7AM, please contact night-coverage [email or web address] 12/13/2019, 8:28 AM

## 2019-12-14 ENCOUNTER — Ambulatory Visit: Payer: Medicare HMO | Attending: Internal Medicine

## 2019-12-14 DIAGNOSIS — Z23 Encounter for immunization: Secondary | ICD-10-CM

## 2019-12-14 LAB — BASIC METABOLIC PANEL
Anion gap: 6 (ref 5–15)
BUN: 16 mg/dL (ref 8–23)
CO2: 27 mmol/L (ref 22–32)
Calcium: 9 mg/dL (ref 8.9–10.3)
Chloride: 101 mmol/L (ref 98–111)
Creatinine, Ser: 0.7 mg/dL (ref 0.44–1.00)
GFR calc Af Amer: >60 mL/min (ref >60)
GFR calc non Af Amer: >60 mL/min (ref >60)
Glucose, Bld: 133 mg/dL — ABNORMAL HIGH (ref 70–99)
Potassium: 4 mmol/L (ref 3.5–5.1)
Sodium: 134 mmol/L — ABNORMAL LOW (ref 135–145)

## 2019-12-14 LAB — GLUCOSE, CAPILLARY
Glucose-Capillary: 134 mg/dL — ABNORMAL HIGH (ref 70–99)
Glucose-Capillary: 134 mg/dL — ABNORMAL HIGH (ref 70–99)
Glucose-Capillary: 150 mg/dL — ABNORMAL HIGH (ref 70–99)
Glucose-Capillary: 215 mg/dL — ABNORMAL HIGH (ref 70–99)

## 2019-12-14 LAB — RESPIRATORY PANEL BY RT PCR (FLU A&B, COVID)
Influenza A by PCR: NEGATIVE
Influenza B by PCR: NEGATIVE
SARS Coronavirus 2 by RT PCR: NEGATIVE

## 2019-12-14 LAB — CBC
HCT: 22.7 % — ABNORMAL LOW (ref 36.0–46.0)
Hemoglobin: 7.6 g/dL — ABNORMAL LOW (ref 12.0–15.0)
MCH: 29.3 pg (ref 26.0–34.0)
MCHC: 33.5 g/dL (ref 30.0–36.0)
MCV: 87.6 fL (ref 80.0–100.0)
Platelets: 165 10*3/uL (ref 150–400)
RBC: 2.59 MIL/uL — ABNORMAL LOW (ref 3.87–5.11)
RDW: 15.2 % (ref 11.5–15.5)
WBC: 4.6 10*3/uL (ref 4.0–10.5)
nRBC: 0 % (ref 0.0–0.2)

## 2019-12-14 MED ORDER — METOPROLOL TARTRATE 25 MG PO TABS: 25.0000 mg | ORAL_TABLET | Freq: Two times a day (BID) | ORAL | 0 refills | Status: DC

## 2019-12-14 NOTE — Progress Notes (Signed)
  Subjective: 3 Days Post-Op Procedure(s) (LRB): OPEN REDUCTION INTERNAL FIXATION (ORIF) ANKLE FRACTURE (Right)  POD5 Reduction and internal fixation of left intertrochanteric hip fracture Patient reports pain as moderate in both the left hip and right ankle. Patient is well, and has had no acute complaints or problems Plan is to go to SNF Negative for chest pain and shortness of breath Fever: no Gastrointestinal:Negative for nausea and vomiting Patient has not had a BM at this time.  Objective: Vital signs in last 24 hours: Temp:  [98.9 F (37.2 C)-99.2 F (37.3 C)] 99.2 F (37.3 C) (09/23 0028) Pulse Rate:  [63-78] 78 (09/23 0028) Resp:  [18] 18 (09/23 0028) BP: (136-149)/(45-52) 149/45 (09/23 0028) SpO2:  [94 %-96 %] 94 % (09/23 0028)  Intake/Output from previous day:  Intake/Output Summary (Last 24 hours) at 12/14/2019 0746 Last data filed at 12/14/2019 0420 Gross per 24 hour  Intake 480 ml  Output 500 ml  Net -20 ml    Intake/Output this shift: No intake/output data recorded.  Labs: Recent Labs    12/12/19 0436 12/13/19 0450  HGB 8.5* 7.9*   Recent Labs    12/12/19 0436 12/13/19 0450  WBC 6.2 5.0  RBC 2.90* 2.72*  HCT 25.7* 24.6*  PLT 133* 150   Recent Labs    12/13/19 0450 12/14/19 0504  NA 133* 134*  K 4.0 4.0  CL 101 101  CO2 24 27  BUN 14 16  CREATININE 0.80 0.70  GLUCOSE 113* 133*  CALCIUM 9.2 9.0   No results for input(s): LABPT, INR in the last 72 hours.   EXAM General - Patient is Alert, Appropriate and Oriented Extremity - CAM walker boot intact to the right foot. Patient is intact to light touch over the dorsal and volar aspect of bilateral feet, dorsiflexion and plantarflexion intact. Cap refill intact bilaterally. Intact dorsalis pedis pulses. Moderate ecchymosis to the left posterior thigh, no drainage to the honeycomb dressings. Dressing/Incision - clean, dry, no drainage Motor Function - intact, moving foot and toes well on  exam.  Abdomen soft with normal bowel sounds, mild distention.  Past Medical History:  Diagnosis Date  . Diabetes mellitus without complication (HCC)   . Hypertension     Assessment/Plan: 3 Days Post-Op Procedure(s) (LRB): OPEN REDUCTION INTERNAL FIXATION (ORIF) ANKLE FRACTURE (Right) Principal Problem:   Intertrochanteric fracture of left hip (HCC) Active Problems:   Diabetes mellitus (HCC)   Nicotine dependence   Essential hypertension   Obesity (BMI 30-39.9)   Fall  Estimated body mass index is 29.19 kg/m as calculated from the following:   Height as of this encounter: 5\' 8"  (1.727 m).   Weight as of this encounter: 87.1 kg. Advance diet Up with therapy D/C IV fluids  When tolerating po intake.  Moderate ecchymosis, patient was on Xarelto prior to surgery. Na 134 this AM. Continue with PT today, currently plan is for d/c to SNF when able.  Patient is progressing slowly with PT. Patient has not had a BM yet, move on to FLEET enema today.  Upon discharge, follow-up with [facility] in 14 days for staple removal. Continue on Xarelto for DVT prevention.  DVT Prophylaxis - Xarelto and TED hose Weight-Bearing as tolerated to bilateral legs, must be in CAM boot to weightbear to the right leg.  , PA-C [facility] Orthopaedic Surgery 12/14/2019, 7:46 AM

## 2019-12-14 NOTE — Progress Notes (Signed)
Physical Therapy Treatment Patient Details Name: Melanie Hammond MRN: [id number] DOB: 17-Jun-1950 Today's Date: 12/14/2019    History of Present Illness Melanie Hammond is a 68 y.o. female with medical history significant for diabetes mellitus, hypertension, nicotine dependence and history of mitral valve prolapse who was brought into the ER by EMS after she fell on stairs landing on her left side.  Patient was unable to get up after her fall due to severe pain.  Left femur fracture with ORIF IM nailing, then ORIF of the L ankle fracture 9/20.    PT Comments    Pt ready for session.  Participated in exercises as described below.  To EOB with mod a x 1 for LE management.  Steady in sitting.  Stood with mod a x 1 with slow to stand.  Pt hesitant in standing today taking only small steps with heavy lean on walker.  +2 called for assist/safety.  She is only able to transfer to recliner at this time.  Reports increased fatigue today and UE weakness.  Remained in recliner after session.   Follow Up Recommendations  SNF     Equipment Recommendations       Recommendations for Other Services       Precautions / Restrictions Precautions Precautions: Fall Restrictions Weight Bearing Restrictions: Yes RLE Weight Bearing: Weight bearing as tolerated LLE Weight Bearing: Weight bearing as tolerated    Mobility  Bed Mobility Overal bed mobility: Needs Assistance Bed Mobility: Sit to Supine     Supine to sit: Mod assist        Transfers Overall transfer level: Needs assistance Equipment used: Rolling walker (2 wheeled) Transfers: Sit to/from Stand Sit to Stand: Mod assist            Ambulation/Gait Ambulation/Gait assistance: Min assist;Mod assist;+2 physical assistance Gait Distance (Feet): 3 Feet Assistive device: Rolling walker (2 wheeled) Gait Pattern/deviations: Step-to pattern;Trunk flexed Gait velocity:  extremely decreased   General Gait Details: heavy lean on walker,  hesitant today   Stairs             Wheelchair Mobility    Modified Rankin (Stroke Patients Only)       Balance Overall balance assessment: Needs assistance Sitting-balance support: Bilateral upper extremity supported Sitting balance-Leahy Scale: Good     Standing balance support: Bilateral upper extremity supported Standing balance-Leahy Scale: Poor Standing balance comment: very poor confidence, RW reliant                            Cognition Arousal/Alertness: Awake/alert Behavior During Therapy: WFL for tasks assessed/performed Overall Cognitive Status: Within Functional Limits for tasks assessed                                        Exercises General Exercises - Lower Extremity Ankle Circles/Pumps: Left;Strengthening;10 reps Quad Sets: Strengthening;10 reps;Left Short Arc Quad: 10 reps;AAROM;Left Heel Slides: Strengthening;10 reps;AAROM Hip ABduction/ADduction: AAROM;Strengthening;10 reps (resisted on R, AROM tolerated only on L)    General Comments        Pertinent Vitals/Pain Pain Assessment: Faces Faces Pain Scale: Hurts little more Pain Location: L thigh/hip, R ankle Pain Descriptors / Indicators: Operative site guarding;Sharp Pain Intervention(s): Limited activity within patient's tolerance;Monitored during session;Repositioned    Home Living  Prior Function            PT Goals (current goals can now be found in the care plan section) Progress towards PT goals: Progressing toward goals    Frequency    BID      PT Plan Current plan remains appropriate    Co-evaluation              AM-PAC PT "6 Clicks" Mobility   Outcome Measure  Help needed turning from your back to your side while in a flat bed without using bedrails?: A Little Help needed moving from lying on your back to sitting on the side of a flat bed without using bedrails?: A Little Help needed moving to and  from a bed to a chair (including a wheelchair)?: A Lot Help needed standing up from a chair using your arms (e.g., wheelchair or bedside chair)?: A Lot Help needed to walk in hospital room?: A Lot Help needed climbing 3-5 steps with a railing? : Total 6 Click Score: 13    End of Session Equipment Utilized During Treatment: Gait belt Activity Tolerance: Patient limited by fatigue Patient left: in chair;with call bell/phone within reach;with chair alarm set Nurse Communication: Mobility status Pain - Right/Left: Left Pain - part of body: Hip     Time: 4034-7425 PT Time Calculation (min) (ACUTE ONLY): 25 min  Charges:  $Gait Training: 8-22 mins $Therapeutic Exercise: 8-22 mins                    Danielle Dess, PTA 12/14/19, 1:54 PM

## 2019-12-14 NOTE — Discharge Summary (Signed)
Physician Discharge Summary  Melanie Hammond ZOX:[id number] DOB: [birth year] DOA: 12/09/2019  PCP: Patient, No Pcp Per  Admit date: 12/09/2019 Discharge date: 12/14/2019  Admitted From: home Disposition: SNF  Recommendations for Outpatient Follow-up:  1. Follow up with PCP in 1-2 weeks 2. F/u ortho surg, Dr. Joice Lofts, in 2 weeks 3.  Home Health: no Equipment/Devices:  Discharge Condition: stable CODE STATUS: full  Diet recommendation: Heart Healthy / Carb Modified  Brief/Interim Summary: HPI was taken from Dr. Joylene Igo: Melanie Hammond is a 69 y.o. female with medical history significant for diabetes mellitus, hypertension, nicotine dependence and history of mitral valve prolapse who was brought into the ER by EMS after she fell landing on her left side.  Patient was unable to get up after her fall due to severe pain.  Patient stated that she fell while walking up some stairs landing on her left side.  Left leg appears to be shortened and internally rotated.  She denies any head trauma loss of consciousness.  She denies feeling dizzy or lightheaded prior to the fall. She complains of shortness of breath but denies having any chest pain, no nausea, no vomiting, no diaphoresis or palpitations. She denies having any abdominal pain or any changes in her bowel habits. Labs show sodium of 132, potassium of 4, chloride 100, bicarb 23, BUN 7, creatinine 0.95, white count 6.8, hemoglobin 13.8, hematocrit 41.5, MCV 88.1, RDW 14.6, platelet count 142 Chest x-ray reviewed by me shows no obvious infiltrate or effusion X-ray of the left hip shows moderately displaced intertrochanteric fracture of the left hip Twelve-lead EKG reviewed by me shows sinus rhythm with ST depression in the lateral leads    ED Course: Patient is a 69 year old female with a history of diabetes mellitus, hypertension, nicotine dependence and history of mitral valve prolapse who presents to the ER after a mechanical fall at home.  She has  a left intertrochanteric femur fracture will be admitted to the hospital for further evaluation.  Hospital Course from Dr. Wilfred Lacy 9/22-9/23/21: Pt presented after a fall and was found to have right ankle fracture and left hip fracture. Pt is s/p open and internal fixation of medial malleolar fracture and s/p proximal fracture w/ IM nail fixation for left hip fracture. Pt tolerated the surgeries fairly well. PT/OT saw the pt and recommend SNF. For more information please see other progress notes.   Discharge Diagnoses:  Principal Problem:   Intertrochanteric fracture of left hip (HCC) Active Problems:   Diabetes mellitus (HCC)   Nicotine dependence   Essential hypertension   Obesity (BMI 30-39.9)   Fall Right ankle fracture: secondary to fall. S/p open reduction & internal fixation of medial malleolar fracture, right ankle on 12/11/19 as per ortho surgery.   Left hip fracture: secondary to fall. s/p proximal fracture w/ IM nail fixation.  Hyponatremia: improving w/ IVFs. Will continue to monitor   Acute blood loss anemia: likely secondary to recent surgery. Will transfuse if Hb <7.0  Smoker: smoking cessation counseling. Nicotine patch to prevent w/drawal   HTN: continue on home dose of irbesartan   DM2: well controlled. Continue on SSI w/ accuchecks while inpatient only   Hypothyroidism: will continue on home dose of levothyroxine   Overweight: BMI 29.1. Would benefit from weight loss   Depression: severity unknown. Continue on home dose bupropion, buspirone, & sertraline  Discharge Instructions  Discharge Instructions    Diet - low sodium heart healthy   Complete by: As directed    Diet  Carb Modified   Complete by: As directed    Discharge instructions   Complete by: As directed    F/u PCP in 1 week. F/u ortho surg, Dr. Joice Lofts, in 2 weeks   Discharge wound care:   Complete by: As directed    Wound care as stated above   Increase activity slowly   Complete by: As  directed      Allergies as of 12/14/2019      Reactions   Compazine [prochlorperazine]    Thorazine [chlorpromazine]       Medication List    STOP taking these medications   amLODipine 10 MG tablet Commonly known as: NORVASC     TAKE these medications   amitriptyline 100 MG tablet Commonly known as: ELAVIL Take 100 mg by mouth at bedtime.   ascorbic acid 500 MG tablet Commonly known as: VITAMIN C Take 500 mg by mouth daily.   aspirin 81 MG chewable tablet Chew 81 mg by mouth daily.   bimatoprost 0.03 % ophthalmic solution Commonly known as: LUMIGAN Apply 1 drop to eye every evening.   buPROPion 150 MG 24 hr tablet Commonly known as: WELLBUTRIN XL Take 150 mg by mouth daily.   busPIRone 15 MG tablet Commonly known as: BUSPAR Take 15 mg by mouth in the morning and at bedtime.   carisoprodol 350 MG tablet Commonly known as: SOMA Take 350 mg by mouth 3 (three) times daily.   Cholecalciferol 25 MCG (1000 UT) capsule Take 1,000 Units by mouth daily.   donepezil 5 MG tablet Commonly known as: ARICEPT Take 5 mg by mouth every evening.   dorzolamidel-timolol 22.3-6.8 MG/ML Soln ophthalmic solution Commonly known as: COSOPT Place 1 drop into the left eye 2 (two) times daily.   fluticasone furoate-vilanterol 100-25 MCG/INH Aepb Commonly known as: BREO ELLIPTA Inhale 1 puff into the lungs daily.   folic acid 800 MCG tablet Commonly known as: FOLVITE Take 400 mcg by mouth daily.   hydrochlorothiazide 25 MG tablet Commonly known as: HYDRODIURIL Take 25 mg by mouth daily.   hydrOXYzine 10 MG tablet Commonly known as: ATARAX/VISTARIL Take 10-50 mg by mouth 2 (two) times daily as needed for anxiety.   irbesartan 300 MG tablet Commonly known as: AVAPRO Take 300 mg by mouth daily.   levothyroxine 50 MCG tablet Commonly known as: SYNTHROID Take 50 mcg by mouth daily.   metFORMIN 500 MG tablet Commonly known as: GLUCOPHAGE Take 500 mg by mouth daily with  breakfast.   metoprolol tartrate 25 MG tablet Commonly known as: LOPRESSOR Take 1 tablet (25 mg total) by mouth 2 (two) times daily. What changed:   medication strength  how much to take  when to take this   Omega-3 1000 MG Caps Take 1,000 mg by mouth daily.   oxyCODONE 5 MG immediate release tablet Commonly known as: Oxy IR/ROXICODONE Take 1-2 tablets (5-10 mg total) by mouth every 4 (four) hours as needed for moderate pain (pain score 4-6).   PriLOSEC 20 MG capsule Generic drug: omeprazole Take 20 mg by mouth daily.   rivaroxaban 20 MG Tabs tablet Commonly known as: XARELTO Take 20 mg by mouth daily with breakfast.   sertraline 50 MG tablet Commonly known as: ZOLOFT Take 150 mg by mouth daily.   spironolactone 25 MG tablet Commonly known as: ALDACTONE Take 25 mg by mouth daily.   traMADol 50 MG tablet Commonly known as: ULTRAM Take 1 tablet (50 mg total) by mouth every 6 (six) hours as  needed for moderate pain.   traZODone 100 MG tablet Commonly known as: DESYREL Take 100 mg by mouth in the morning and at bedtime.   Ventolin HFA 108 (90 Base) MCG/ACT inhaler Generic drug: albuterol Inhale 1-2 puffs into the lungs every 6 (six) hours as needed for wheezing or shortness of breath.   verapamil 240 MG 24 hr capsule Commonly known as: VERELAN PM Take 240 mg by mouth in the morning and at bedtime.            Discharge Care Instructions  (From admission, onward)         Start     Ordered   12/14/19 0000  Discharge wound care:       Comments: Wound care as stated above   12/14/19 1328          Contact information for after-discharge care    Destination    [facility] Preferred SNF .   Service: Skilled Nursing Contact information: [address] [place] [address] [phone]                 Allergies  Allergen Reactions  . Compazine [Prochlorperazine]   . Thorazine [Chlorpromazine]      Consultations:  Dr. Joice Lofts , ortho surg    Procedures/Studies: DG Ankle 2 Views Right  Result Date: 12/11/2019 CLINICAL DATA:  Right ankle surgery EXAM: RIGHT ANKLE - 2 VIEW COMPARISON:  12/09/2019 FINDINGS: The patient has undergone ORIF of the right ankle with 2 cannulated screws coursing through the medial malleolus. The alignment appears near anatomic. The hardware is intact. There are expected postsurgical changes. IMPRESSION: Status post ORIF of the right ankle with expected postsurgical changes. Electronically Signed   By: Katherine Mantle M.D.   On: 12/11/2019 15:02   DG Ankle Complete Right  Result Date: 12/10/2019 CLINICAL DATA:  Right ankle pain and swelling, fall yesterday EXAM: RIGHT ANKLE - COMPLETE 3+ VIEW COMPARISON:  None. FINDINGS: There is a displaced fracture of the right medial malleolus and a probable nondisplaced fracture of the lateral malleolus evidenced by subtle cortical buckling on mortise view. There is no widening of the ankle mortise. The posterior malleolus is intact. Diffuse soft tissue edema about the ankle. IMPRESSION: There is a displaced fracture of the right medial malleolus and a probable nondisplaced fracture of the lateral malleolus evidenced by subtle cortical buckling on mortise view. There is no widening of the ankle mortise. Electronically Signed   By: Lauralyn Primes M.D.   On: 12/10/2019 10:57   DG Chest Port 1 View  Result Date: 12/09/2019 CLINICAL DATA:  Fall today with left hip pain. EXAM: PORTABLE CHEST 1 VIEW COMPARISON:  None. FINDINGS: Lordotic technique is demonstrated. Lungs are adequately inflated and otherwise clear. Cardiomediastinal silhouette is normal. Mild diffuse decreased bone mineralization. Surgical clips over the right axilla. IMPRESSION: No acute cardiopulmonary disease. Electronically Signed   By: Elberta Fortis M.D.   On: 12/09/2019 11:00   DG C-Arm 1-60 Min  Result Date: 12/11/2019 CLINICAL DATA:  Ankle surgery EXAM: DG  C-ARM 1-60 MIN FLUOROSCOPY TIME:  Fluoroscopy Time:  35 seconds Number of Acquired Spot Images: 5 COMPARISON:  12/09/2019 FINDINGS: The patient has undergone ORIF of the medial malleolus. There are 2 cannulated screws, both of which appear well positioned and are intact. There are expected postsurgical changes. IMPRESSION: Status post ORIF of the medial malleolus. Electronically Signed   By: Katherine Mantle M.D.   On: 12/11/2019 15:03   DG  C-Arm 1-60 Min  Result Date: 12/09/2019 CLINICAL DATA:  The hip fracture EXAM: LEFT FEMUR 2 VIEWS; DG C-ARM 1-60 MIN COMPARISON:  None. FINDINGS: Six intraop views were submitted for review of IM nail fixation the proximal femur fracture. Fluoro time 1 minutes 17 seconds IMPRESSION: Status post ORIF proximal femur fracture with IM nail fixation Electronically Signed   By: Jonna Clark M.D.   On: 12/09/2019 15:51   DG Hip Unilat With Pelvis 2-3 Views Left  Result Date: 12/09/2019 CLINICAL DATA:  Fall today with left hip pain. EXAM: DG HIP (WITH OR WITHOUT PELVIS) 2-3V LEFT COMPARISON:  None. FINDINGS: Exam demonstrates a moderately displaced intertrochanteric fracture of the left hip. Minimal symmetric degenerative changes of the hips. Mild degenerate change of the spine. Mild diffuse osteopenia. IMPRESSION: Moderately displaced intertrochanteric fracture of the left hip. Electronically Signed   By: Elberta Fortis M.D.   On: 12/09/2019 10:59   DG FEMUR MIN 2 VIEWS LEFT  Result Date: 12/09/2019 CLINICAL DATA:  The hip fracture EXAM: LEFT FEMUR 2 VIEWS; DG C-ARM 1-60 MIN COMPARISON:  None. FINDINGS: Six intraop views were submitted for review of IM nail fixation the proximal femur fracture. Fluoro time 1 minutes 17 seconds IMPRESSION: Status post ORIF proximal femur fracture with IM nail fixation Electronically Signed   By: Jonna Clark M.D.   On: 12/09/2019 15:51     Subjective: Pt c/o left hip pain    Discharge Exam: Vitals:   12/14/19 0028 12/14/19 0748   BP: (!) 149/45 (!) 172/59  Pulse: 78 78  Resp: 18 16  Temp: 99.2 F (37.3 C) 98.3 F (36.8 C)  SpO2: 94% 96%   Vitals:   12/13/19 0741 12/13/19 1557 12/14/19 0028 12/14/19 0748  BP: (!) 159/53 (!) 136/52 (!) 149/45 (!) 172/59  Pulse: 64 63 78 78  Resp: 18 18 18 16   Temp: 98.4 F (36.9 C) 98.9 F (37.2 C) 99.2 F (37.3 C) 98.3 F (36.8 C)  TempSrc: Oral Oral Oral Oral  SpO2: 97% 96% 94% 96%  Weight:      Height:        General: Pt is alert, awake, not in acute distress Cardiovascular: S1/S2 +, no rubs, no gallops Respiratory: CTA bilaterally, no wheezing, no rhonchi Abdominal: Soft, NT, ND, bowel sounds + Extremities:  no cyanosis    The results of significant diagnostics from this hospitalization (including imaging, microbiology, ancillary and laboratory) are listed below for reference.     Microbiology: Recent Results (from the past 240 hour(s))  SARS Coronavirus 2 by RT PCR (hospital order, performed in [facility] hospital lab) Nasopharyngeal Nasopharyngeal Swab     Status: None   Collection Time: 12/09/19 10:34 AM   Specimen: Nasopharyngeal Swab  Result Value Ref Range Status   SARS Coronavirus 2 NEGATIVE NEGATIVE Final    Comment: (NOTE) SARS-CoV-2 target nucleic acids are NOT DETECTED.  The SARS-CoV-2 RNA is generally detectable in upper and lower respiratory specimens during the acute phase of infection. The lowest concentration of SARS-CoV-2 viral copies this assay can detect is 250 copies / mL. A negative result does not preclude SARS-CoV-2 infection and should not be used as the sole basis for treatment or other patient management decisions.  A negative result may occur with improper specimen collection / handling, submission of specimen other than nasopharyngeal swab, presence of viral mutation(s) within the areas targeted by this assay, and inadequate number of viral copies (<250 copies / mL). A negative result must be  combined with  clinical observations, patient history, and epidemiological information.  Fact Sheet for Patients:   BoilerBrush.com.cyhttps://www.fda.gov/media/136312/download  Fact Sheet for Healthcare Providers: https://pope.com/https://www.fda.gov/media/136313/download  This test is not yet approved or  cleared by the [place] FDA and has been authorized for detection and/or diagnosis of SARS-CoV-2 by FDA under an Emergency Use Authorization (EUA).  This EUA will remain in effect (meaning this test can be used) for the duration of the COVID-19 declaration under Section 564(b)(1) of the Act, 21 U.S.C. section 360bbb-3(b)(1), unless the authorization is terminated or revoked sooner.  Performed at [facility], [address] [place], [place] [address]   Respiratory Panel by RT PCR (Flu A&B, Covid) - Nasopharyngeal Swab     Status: None   Collection Time: 12/14/19 11:11 AM   Specimen: Nasopharyngeal Swab  Result Value Ref Range Status   SARS Coronavirus 2 by RT PCR NEGATIVE NEGATIVE Final    Comment: (NOTE) SARS-CoV-2 target nucleic acids are NOT DETECTED.  The SARS-CoV-2 RNA is generally detectable in upper respiratoy specimens during the acute phase of infection. The lowest concentration of SARS-CoV-2 viral copies this assay can detect is 131 copies/mL. A negative result does not preclude SARS-Cov-2 infection and should not be used as the sole basis for treatment or other patient management decisions. A negative result may occur with  improper specimen collection/handling, submission of specimen other than nasopharyngeal swab, presence of viral mutation(s) within the areas targeted by this assay, and inadequate number of viral copies (<131 copies/mL). A negative result must be combined with clinical observations, patient history, and epidemiological information. The expected result is Negative.  Fact Sheet for Patients:  https://www.moore.com/https://www.fda.gov/media/142436/download  Fact Sheet for Healthcare Providers:   https://www.young.biz/https://www.fda.gov/media/142435/download  This test is no t yet approved or cleared by the [place] FDA and  has been authorized for detection and/or diagnosis of SARS-CoV-2 by FDA under an Emergency Use Authorization (EUA). This EUA will remain  in effect (meaning this test can be used) for the duration of the COVID-19 declaration under Section 564(b)(1) of the Act, 21 U.S.C. section 360bbb-3(b)(1), unless the authorization is terminated or revoked sooner.     Influenza A by PCR NEGATIVE NEGATIVE Final   Influenza B by PCR NEGATIVE NEGATIVE Final    Comment: (NOTE) The Xpert Xpress SARS-CoV-2/FLU/RSV assay is intended as an aid in  the diagnosis of influenza from Nasopharyngeal swab specimens and  should not be used as a sole basis for treatment. Nasal washings and  aspirates are unacceptable for Xpert Xpress SARS-CoV-2/FLU/RSV  testing.  Fact Sheet for Patients: https://www.moore.com/https://www.fda.gov/media/142436/download  Fact Sheet for Healthcare Providers: https://www.young.biz/https://www.fda.gov/media/142435/download  This test is not yet approved or cleared by the [place] FDA and  has been authorized for detection and/or diagnosis of SARS-CoV-2 by  FDA under an Emergency Use Authorization (EUA). This EUA will remain  in effect (meaning this test can be used) for the duration of the  Covid-19 declaration under Section 564(b)(1) of the Act, 21  U.S.C. section 360bbb-3(b)(1), unless the authorization is  terminated or revoked. Performed at [facility], [address] [place], [place] [address]      Labs: BNP (last 3 results) No results for input(s): BNP in the last 8760 hours. Basic Metabolic Panel: Recent Labs  Lab 12/10/19 0347 12/11/19 0624 12/12/19 0436 12/13/19 0450 12/14/19 0504  NA 130* 133* 135 133* 134*  K 4.6 4.1 4.5 4.0 4.0  CL 98 101 102 101 101  CO2 26 24 27  24  27  GLUCOSE 162* 172* 164* 113* 133*  BUN 10 7* 9 14 16   CREATININE 1.03* 0.68 0.64 0.80 0.70   CALCIUM 8.6* 8.6* 8.9 9.2 9.0   Liver Function Tests: No results for input(s): AST, ALT, ALKPHOS, BILITOT, PROT, ALBUMIN in the last 168 hours. No results for input(s): LIPASE, AMYLASE in the last 168 hours. No results for input(s): AMMONIA in the last 168 hours. CBC: Recent Labs  Lab 12/09/19 1115 12/09/19 1115 12/10/19 0347 12/11/19 0624 12/12/19 0436 12/13/19 0450 12/14/19 0504  WBC 6.8   < > 6.1 6.3 6.2 5.0 4.6  NEUTROABS 5.6  --   --   --   --   --   --   HGB 13.8   < > 10.2* 9.7* 8.5* 7.9* 7.6*  HCT 41.5   < > 30.8* 27.7* 25.7* 24.6* 22.7*  MCV 88.1   < > 89.0 83.9 88.6 90.4 87.6  PLT 142*   < > 158 143* 133* 150 165   < > = values in this interval not displayed.   Cardiac Enzymes: No results for input(s): CKTOTAL, CKMB, CKMBINDEX, TROPONINI in the last 168 hours. BNP: Invalid input(s): POCBNP CBG: Recent Labs  Lab 12/13/19 2012 12/14/19 0030 12/14/19 0415 12/14/19 0747 12/14/19 1138  GLUCAP 180* 150* 134* 134* 215*   D-Dimer No results for input(s): DDIMER in the last 72 hours. Hgb A1c No results for input(s): HGBA1C in the last 72 hours. Lipid Profile No results for input(s): CHOL, HDL, LDLCALC, TRIG, CHOLHDL, LDLDIRECT in the last 72 hours. Thyroid function studies No results for input(s): TSH, T4TOTAL, T3FREE, THYROIDAB in the last 72 hours.  Invalid input(s): FREET3 Anemia work up No results for input(s): VITAMINB12, FOLATE, FERRITIN, TIBC, IRON, RETICCTPCT in the last 72 hours. Urinalysis No results found for: COLORURINE, APPEARANCEUR, LABSPEC, PHURINE, GLUCOSEU, HGBUR, BILIRUBINUR, KETONESUR, PROTEINUR, UROBILINOGEN, NITRITE, LEUKOCYTESUR Sepsis Labs Invalid input(s): PROCALCITONIN,  WBC,  LACTICIDVEN Microbiology Recent Results (from the past 240 hour(s))  SARS Coronavirus 2 by RT PCR (hospital order, performed in [facility] hospital lab) Nasopharyngeal Nasopharyngeal Swab     Status: None   Collection Time: 12/09/19 10:34 AM   Specimen:  Nasopharyngeal Swab  Result Value Ref Range Status   SARS Coronavirus 2 NEGATIVE NEGATIVE Final    Comment: (NOTE) SARS-CoV-2 target nucleic acids are NOT DETECTED.  The SARS-CoV-2 RNA is generally detectable in upper and lower respiratory specimens during the acute phase of infection. The lowest concentration of SARS-CoV-2 viral copies this assay can detect is 250 copies / mL. A negative result does not preclude SARS-CoV-2 infection and should not be used as the sole basis for treatment or other patient management decisions.  A negative result may occur with improper specimen collection / handling, submission of specimen other than nasopharyngeal swab, presence of viral mutation(s) within the areas targeted by this assay, and inadequate number of viral copies (<250 copies / mL). A negative result must be combined with clinical observations, patient history, and epidemiological information.  Fact Sheet for Patients:   BoilerBrush.com.cy  Fact Sheet for Healthcare Providers: https://pope.com/  This test is not yet approved or  cleared by the [place] FDA and has been authorized for detection and/or diagnosis of SARS-CoV-2 by FDA under an Emergency Use Authorization (EUA).  This EUA will remain in effect (meaning this test can be used) for the duration of the COVID-19 declaration under Section 564(b)(1) of the Act, 21 U.S.C. section 360bbb-3(b)(1), unless the authorization is terminated or revoked  sooner.  Performed at [facility], [address] [place], [place] [address]   Respiratory Panel by RT PCR (Flu A&B, Covid) - Nasopharyngeal Swab     Status: None   Collection Time: 12/14/19 11:11 AM   Specimen: Nasopharyngeal Swab  Result Value Ref Range Status   SARS Coronavirus 2 by RT PCR NEGATIVE NEGATIVE Final    Comment: (NOTE) SARS-CoV-2 target nucleic acids are NOT DETECTED.  The SARS-CoV-2 RNA is generally  detectable in upper respiratoy specimens during the acute phase of infection. The lowest concentration of SARS-CoV-2 viral copies this assay can detect is 131 copies/mL. A negative result does not preclude SARS-Cov-2 infection and should not be used as the sole basis for treatment or other patient management decisions. A negative result may occur with  improper specimen collection/handling, submission of specimen other than nasopharyngeal swab, presence of viral mutation(s) within the areas targeted by this assay, and inadequate number of viral copies (<131 copies/mL). A negative result must be combined with clinical observations, patient history, and epidemiological information. The expected result is Negative.  Fact Sheet for Patients:  https://www.moore.com/  Fact Sheet for Healthcare Providers:  https://www.young.biz/  This test is no t yet approved or cleared by the [place] FDA and  has been authorized for detection and/or diagnosis of SARS-CoV-2 by FDA under an Emergency Use Authorization (EUA). This EUA will remain  in effect (meaning this test can be used) for the duration of the COVID-19 declaration under Section 564(b)(1) of the Act, 21 U.S.C. section 360bbb-3(b)(1), unless the authorization is terminated or revoked sooner.     Influenza A by PCR NEGATIVE NEGATIVE Final   Influenza B by PCR NEGATIVE NEGATIVE Final    Comment: (NOTE) The Xpert Xpress SARS-CoV-2/FLU/RSV assay is intended as an aid in  the diagnosis of influenza from Nasopharyngeal swab specimens and  should not be used as a sole basis for treatment. Nasal washings and  aspirates are unacceptable for Xpert Xpress SARS-CoV-2/FLU/RSV  testing.  Fact Sheet for Patients: https://www.moore.com/  Fact Sheet for Healthcare Providers: https://www.young.biz/  This test is not yet approved or cleared by the [place] FDA and   has been authorized for detection and/or diagnosis of SARS-CoV-2 by  FDA under an Emergency Use Authorization (EUA). This EUA will remain  in effect (meaning this test can be used) for the duration of the  Covid-19 declaration under Section 564(b)(1) of the Act, 21  U.S.C. section 360bbb-3(b)(1), unless the authorization is  terminated or revoked. Performed at [facility], [address] [place], [place] [address]      Time coordinating discharge: Over 30 minutes  SIGNED:   Charise Killian, MD  [facility] 12/14/2019, 1:28 PM Pager   If 7PM-7AM, please contact night-coverage [email or web address]

## 2019-12-14 NOTE — Progress Notes (Signed)
   Covid-19 Vaccination Clinic  Name:  Melanie Hammond    MRN: [id number] DOB: 1950-06-10  12/14/2019  [name] was observed post Covid-19 immunization for 15 minutes without incident. She was provided with Vaccine Information Sheet and instruction to access the V-Safe system.   [name] was instructed to call 911 with any severe reactions post vaccine: Marland Kitchen Difficulty breathing  . Swelling of face and throat  . A fast heartbeat  . A bad rash all over body  . Dizziness and weakness   Immunizations Administered    Name Date Dose VIS Date Route   Pfizer COVID-19 Vaccine 12/14/2019  2:17 PM 0.3 mL 05/17/2018 Intramuscular   Manufacturer: [employer], [employer]   Lot: [id number]   NDC: [address]-3

## 2019-12-14 NOTE — Progress Notes (Signed)
Report called to [facility] at [facility]

## 2019-12-14 NOTE — Consult Note (Addendum)
ANTICOAGULATION CONSULT NOTE  Pharmacy Consult for Xarelto Indication: VTE prophylaxis  Allergies  Allergen Reactions   Compazine [Prochlorperazine]    Thorazine [Chlorpromazine]     Patient Measurements: Height: 5\' 8"  (172.7 cm) Weight: 87.1 kg (192 lb) IBW/kg (Calculated) : 63.9   Vital Signs: Temp: 98.3 F (36.8 C) (09/23 0748) Temp Source: Oral (09/23 0748) BP: 172/59 (09/23 0748) Pulse Rate: 78 (09/23 0748)  Labs: Recent Labs    12/12/19 0436 12/12/19 0436 12/13/19 0450 12/14/19 0504  HGB 8.5*   < > 7.9* 7.6*  HCT 25.7*  --  24.6* 22.7*  PLT 133*  --  150 165  CREATININE 0.64  --  0.80 0.70   < > = values in this interval not displayed.    Estimated Creatinine Clearance: 77.8 mL/min (by C-G formula based on SCr of 0.7 mg/dL).    Assessment: Pharmacy has consulted for DVT ppx in a patient with a PMH significant for mitral valve prolapse, HTN, and diabetes. Patient is admitted for Intertrochanteric fracture of left hip. Per consult from Dr. 12/16/19 on 9/20, consult for DVT ppx and to resume Xarelto.   Looks like pt is on Xarelto 20 mg daily for A flutter per Care Everywhere  Plan:   Will continue Xarelto 20 mg daily with breakfast   Will need SCr and CBC at least every three days per policy  10/20, PharmD 12/14/2019 10:41 AM

## 2022-02-16 ENCOUNTER — Ambulatory Visit: Payer: Medicare HMO | Admitting: Internal Medicine

## 2022-02-20 ENCOUNTER — Ambulatory Visit: Payer: Medicare HMO | Admitting: Internal Medicine

## 2022-02-23 ENCOUNTER — Ambulatory Visit: Payer: Medicare HMO | Admitting: Internal Medicine

## 2022-03-01 ENCOUNTER — Other Ambulatory Visit: Payer: Self-pay

## 2022-03-01 ENCOUNTER — Encounter: Payer: Self-pay | Admitting: Internal Medicine

## 2022-03-01 ENCOUNTER — Observation Stay
Admission: EM | Admit: 2022-03-01 | Discharge: 2022-03-02 | Disposition: A | Discharge status: Home / Self Care | Payer: Medicare HMO | Attending: Student in an Organized Health Care Education/Training Program | Admitting: Student in an Organized Health Care Education/Training Program

## 2022-03-01 ENCOUNTER — Emergency Department: Payer: Medicare HMO

## 2022-03-01 DIAGNOSIS — E119 Type 2 diabetes mellitus without complications: Secondary | ICD-10-CM | POA: Diagnosis not present

## 2022-03-01 DIAGNOSIS — G309 Alzheimer's disease, unspecified: Secondary | ICD-10-CM | POA: Diagnosis not present

## 2022-03-01 DIAGNOSIS — E039 Hypothyroidism, unspecified: Secondary | ICD-10-CM | POA: Diagnosis present

## 2022-03-01 DIAGNOSIS — F0390 Unspecified dementia without behavioral disturbance: Secondary | ICD-10-CM | POA: Insufficient documentation

## 2022-03-01 DIAGNOSIS — Z7982 Long term (current) use of aspirin: Secondary | ICD-10-CM | POA: Insufficient documentation

## 2022-03-01 DIAGNOSIS — I10 Essential (primary) hypertension: Secondary | ICD-10-CM | POA: Diagnosis not present

## 2022-03-01 DIAGNOSIS — F17200 Nicotine dependence, unspecified, uncomplicated: Secondary | ICD-10-CM | POA: Diagnosis not present

## 2022-03-01 DIAGNOSIS — I200 Unstable angina: Principal | ICD-10-CM | POA: Diagnosis present

## 2022-03-01 DIAGNOSIS — R079 Chest pain, unspecified: Secondary | ICD-10-CM | POA: Diagnosis present

## 2022-03-01 DIAGNOSIS — Z7984 Long term (current) use of oral hypoglycemic drugs: Secondary | ICD-10-CM | POA: Diagnosis not present

## 2022-03-01 DIAGNOSIS — F0280 Dementia in other diseases classified elsewhere without behavioral disturbance: Secondary | ICD-10-CM | POA: Insufficient documentation

## 2022-03-01 DIAGNOSIS — Z79899 Other long term (current) drug therapy: Secondary | ICD-10-CM | POA: Diagnosis not present

## 2022-03-01 DIAGNOSIS — F32A Depression, unspecified: Secondary | ICD-10-CM | POA: Diagnosis present

## 2022-03-01 DIAGNOSIS — R0789 Other chest pain: Secondary | ICD-10-CM | POA: Diagnosis present

## 2022-03-01 DIAGNOSIS — I4892 Unspecified atrial flutter: Secondary | ICD-10-CM | POA: Diagnosis present

## 2022-03-01 LAB — TROPONIN I (HIGH SENSITIVITY)
Troponin I (High Sensitivity): 4 ng/L (ref <18)
Troponin I (High Sensitivity): 4 ng/L (ref <18)
Troponin I (High Sensitivity): 4 ng/L (ref <18)
Troponin I (High Sensitivity): 4 ng/L (ref <18)

## 2022-03-01 LAB — CBC
HCT: 41.5 % (ref 36.0–46.0)
Hemoglobin: 13.7 g/dL (ref 12.0–15.0)
MCH: 28.8 pg (ref 26.0–34.0)
MCHC: 33 g/dL (ref 30.0–36.0)
MCV: 87.2 fL (ref 80.0–100.0)
Platelets: 204 10*3/uL (ref 150–400)
RBC: 4.76 MIL/uL (ref 3.87–5.11)
RDW: 15.6 % — ABNORMAL HIGH (ref 11.5–15.5)
WBC: 8 10*3/uL (ref 4.0–10.5)
nRBC: 0 % (ref 0.0–0.2)

## 2022-03-01 LAB — BASIC METABOLIC PANEL
Anion gap: 10 (ref 5–15)
BUN: 12 mg/dL (ref 8–23)
CO2: 20 mmol/L — ABNORMAL LOW (ref 22–32)
Calcium: 9.7 mg/dL (ref 8.9–10.3)
Chloride: 102 mmol/L (ref 98–111)
Creatinine, Ser: 0.72 mg/dL (ref 0.44–1.00)
GFR, Estimated: >60 mL/min (ref >60)
Glucose, Bld: 163 mg/dL — ABNORMAL HIGH (ref 70–99)
Potassium: 3.8 mmol/L (ref 3.5–5.1)
Sodium: 132 mmol/L — ABNORMAL LOW (ref 135–145)

## 2022-03-01 LAB — CBG MONITORING, ED: Glucose-Capillary: 112 mg/dL — ABNORMAL HIGH (ref 70–99)

## 2022-03-01 MED ORDER — PANTOPRAZOLE SODIUM 40 MG PO TBEC
40.0000 mg | DELAYED_RELEASE_TABLET | Freq: Every day | ORAL | Status: DC
  Administered 2022-03-02: 40 mg via ORAL
  Filled 2022-03-01: qty 1

## 2022-03-01 MED ORDER — BUSPIRONE HCL 5 MG PO TABS
15.0000 mg | ORAL_TABLET | Freq: Two times a day (BID) | ORAL | Status: DC
  Administered 2022-03-01 – 2022-03-02 (×2): 15 mg via ORAL
  Filled 2022-03-01 (×2): qty 3

## 2022-03-01 MED ORDER — METOPROLOL TARTRATE 25 MG PO TABS
25.0000 mg | ORAL_TABLET | Freq: Two times a day (BID) | ORAL | Status: DC
  Administered 2022-03-01 – 2022-03-02 (×2): 25 mg via ORAL
  Filled 2022-03-01 (×2): qty 1

## 2022-03-01 MED ORDER — AMITRIPTYLINE HCL 50 MG PO TABS
100.0000 mg | ORAL_TABLET | Freq: Every day | ORAL | Status: DC
  Filled 2022-03-01: qty 2

## 2022-03-01 MED ORDER — ALBUTEROL SULFATE (2.5 MG/3ML) 0.083% IN NEBU: 3.0000 mL | INHALATION_SOLUTION | Freq: Four times a day (QID) | RESPIRATORY_TRACT | Status: DC | PRN

## 2022-03-01 MED ORDER — CARISOPRODOL 350 MG PO TABS
350.0000 mg | ORAL_TABLET | Freq: Three times a day (TID) | ORAL | Status: DC
  Administered 2022-03-02: 350 mg via ORAL
  Filled 2022-03-01 (×2): qty 1

## 2022-03-01 MED ORDER — LEVOTHYROXINE SODIUM 50 MCG PO TABS: 50.0000 ug | ORAL_TABLET | Freq: Every day | ORAL | Status: DC

## 2022-03-01 MED ORDER — DONEPEZIL HCL 5 MG PO TABS
5.0000 mg | ORAL_TABLET | Freq: Every evening | ORAL | Status: DC
  Administered 2022-03-02: 5 mg via ORAL
  Filled 2022-03-01: qty 1

## 2022-03-01 MED ORDER — VERAPAMIL HCL ER 240 MG PO CP24: 240.0000 mg | ORAL_CAPSULE | Freq: Every day | ORAL | Status: DC

## 2022-03-01 MED ORDER — ONDANSETRON HCL 4 MG/2ML IJ SOLN: 4.0000 mg | Freq: Four times a day (QID) | INTRAMUSCULAR | Status: DC | PRN

## 2022-03-01 MED ORDER — ASPIRIN 81 MG PO CHEW
81.0000 mg | CHEWABLE_TABLET | Freq: Every day | ORAL | Status: DC
  Administered 2022-03-02: 81 mg via ORAL
  Filled 2022-03-01: qty 1

## 2022-03-01 MED ORDER — IRBESARTAN 150 MG PO TABS
300.0000 mg | ORAL_TABLET | Freq: Every day | ORAL | Status: DC
  Administered 2022-03-02: 300 mg via ORAL
  Filled 2022-03-01: qty 2

## 2022-03-01 MED ORDER — BUPROPION HCL ER (XL) 150 MG PO TB24
150.0000 mg | ORAL_TABLET | Freq: Every day | ORAL | Status: DC
  Administered 2022-03-02: 150 mg via ORAL
  Filled 2022-03-01: qty 1

## 2022-03-01 MED ORDER — FOLIC ACID 1 MG PO TABS
500.0000 ug | ORAL_TABLET | Freq: Every day | ORAL | Status: DC
  Administered 2022-03-02: 0.5 mg via ORAL
  Filled 2022-03-01: qty 1

## 2022-03-01 MED ORDER — CHOLECALCIFEROL 25 MCG (1000 UT) PO CAPS
1000.0000 [IU] | ORAL_CAPSULE | Freq: Every day | ORAL | Status: DC
  Filled 2022-03-01: qty 1

## 2022-03-01 MED ORDER — RIVAROXABAN 20 MG PO TABS
20.0000 mg | ORAL_TABLET | Freq: Every day | ORAL | Status: DC
  Administered 2022-03-02: 20 mg via ORAL
  Filled 2022-03-01: qty 1

## 2022-03-01 MED ORDER — OMEGA-3 1000 MG PO CAPS
1000.0000 mg | ORAL_CAPSULE | Freq: Every day | ORAL | Status: DC
  Filled 2022-03-01: qty 1

## 2022-03-01 MED ORDER — ACETAMINOPHEN 325 MG PO TABS: 650.0000 mg | ORAL_TABLET | ORAL | Status: DC | PRN

## 2022-03-01 MED ORDER — SERTRALINE HCL 50 MG PO TABS
150.0000 mg | ORAL_TABLET | Freq: Every day | ORAL | Status: DC
  Administered 2022-03-02: 150 mg via ORAL
  Filled 2022-03-01: qty 3

## 2022-03-01 MED ORDER — FLUTICASONE FUROATE-VILANTEROL 100-25 MCG/ACT IN AEPB
1.0000 | INHALATION_SPRAY | Freq: Every day | RESPIRATORY_TRACT | Status: DC
  Filled 2022-03-01 (×2): qty 28

## 2022-03-01 MED ORDER — INSULIN ASPART 100 UNIT/ML IJ SOLN
0.0000 [IU] | Freq: Three times a day (TID) | INTRAMUSCULAR | Status: DC
  Administered 2022-03-02: 3 [IU] via SUBCUTANEOUS
  Administered 2022-03-02: 2 [IU] via SUBCUTANEOUS
  Filled 2022-03-01 (×2): qty 1

## 2022-03-01 MED ORDER — VITAMIN C 500 MG PO TABS
500.0000 mg | ORAL_TABLET | Freq: Every day | ORAL | Status: DC
  Administered 2022-03-02: 500 mg via ORAL
  Filled 2022-03-01: qty 1

## 2022-03-01 NOTE — Assessment & Plan Note (Signed)
Continue bupropion, buspirone and sertraline

## 2022-03-01 NOTE — Assessment & Plan Note (Signed)
Patient presents for evaluation of exertional chest pain associated with shortness of breath and nausea Cardiac risk factors include diabetes mellitus, hypertension and nicotine dependence Cycle troponin Stress test for risk factor stratification Consult cardiology Continue aspirin and metoprololPatient presents for evaluation of exertional chest pain associated with shortness of breath and nausea Cardiac risk factors include diabetes mellitus, hypertension and nicotine dependence Cycle troponin Stress test for risk factor stratification Consult cardiology Continue aspirin and metoprolol

## 2022-03-01 NOTE — ED Triage Notes (Addendum)
Pt arrives via EMS. Per pt she has had cp for the last 6 months off and on. Pt unable to get into cardiology. Pt sts that this chest pain is not going away. Pt received 1 nitro and 324 of aspirin.

## 2022-03-01 NOTE — Assessment & Plan Note (Signed)
Continue metoprolol, verapamil, hydrochlorothiazide, spironolactone and Avapro

## 2022-03-01 NOTE — ED Notes (Signed)
Pharmacy tech came and took soma back to pharmacy.

## 2022-03-01 NOTE — ED Provider Notes (Signed)
[facility] Provider Note    Event Date/Time   First MD Initiated Contact with Patient 03/01/22 1523     (approximate)   History   Chest Pain   HPI  Melanie Hammond is a 71 y.o. female  with a history of diabetes, hypertension, recurrent SVT status post ablation in 2018, and mitral valve prolapse who presents with chest pain that has been present intermittently for several months but worse over the last 1 to 2 weeks.  It is usually exertional and described as pressure.  It is associated with shortness of breath and with generalized weakness.  She states that over the last several days it has escalated to the point where she gets that even with minimal exertion such as walking from her bed to the bathroom, and sometimes has it at rest.  She had an episode like this today.  After nitro and aspirin from EMS she is currently pain-free.  She denies any cough, fever or chills, vomiting or diarrhea, or other acute symptoms.  Reviewed the past medical records.  The patient was most recently admitted in 2021 after mechanical fall and had an intertrochanteric femur fracture at that time.  Her most recent outpatient encounter was with primary care on 11/16 for her hypertension.  She previously had an ablation for SVT in [place] in 2018.  She also had a coronary CT around that time which did not show significant blockages.  She has not had any cardiac workup since that time.  She has no cardiologist here; she states that she tried to follow-up was unable to do so.   Physical Exam   Triage Vital Signs: ED Triage Vitals  Enc Vitals Group     BP 03/01/22 1423 (!) 151/80     Pulse Rate 03/01/22 1423 84     Resp 03/01/22 1423 17     Temp 03/01/22 1423 98.2 F (36.8 C)     Temp Source 03/01/22 1423 Oral     SpO2 03/01/22 1423 99 %     Weight 03/01/22 1426 186 lb (84.4 kg)     Height --      Head Circumference --      Peak Flow --      Pain Score 03/01/22 1426 5     Pain  Loc --      Pain Edu? --      Excl. in [place]? --     Most recent vital signs: Vitals:   03/01/22 1423  BP: (!) 151/80  Pulse: 84  Resp: 17  Temp: 98.2 F (36.8 C)  SpO2: 99%     General: Alert and oriented, comfortable appearing. CV:  Good peripheral perfusion.  Normal heart sounds. Resp:  Normal effort.  Lungs CTAB. Abd:  No distention.  Other:  No peripheral edema.   ED Results / Procedures / Treatments   Labs (all labs ordered are listed, but only abnormal results are displayed) Labs Reviewed  BASIC METABOLIC PANEL - Abnormal; Notable for the following components:      Result Value   Sodium 132 (*)    CO2 20 (*)    Glucose, Bld 163 (*)    All other components within normal limits  CBC - Abnormal; Notable for the following components:   RDW 15.6 (*)    All other components within normal limits  HIV ANTIBODY (ROUTINE TESTING W REFLEX)  HEMOGLOBIN A1C  TROPONIN I (HIGH SENSITIVITY)  TROPONIN I (HIGH SENSITIVITY)  TROPONIN I (HIGH  SENSITIVITY)     EKG  ED ECG REPORT I, Arta Silence, the attending physician, personally viewed and interpreted this ECG.  Date: 03/01/2022 EKG Time: 1428 Rate: 89 Rhythm: normal sinus rhythm QRS Axis: normal Intervals: normal ST/T Wave abnormalities: LVH with repolarization abnormality Narrative Interpretation: no evidence of acute ischemia; no significant change when compared to EKG of 12/09/2019   RADIOLOGY  Chest x-ray: I independently viewed and interpreted the images; there is no focal consolidation or edema  PROCEDURES:  Critical Care performed: No  Procedures   MEDICATIONS ORDERED IN ED: Medications  aspirin chewable tablet 81 mg (has no administration in time range)  irbesartan (AVAPRO) tablet 300 mg (has no administration in time range)  metoprolol tartrate (LOPRESSOR) tablet 25 mg (has no administration in time range)  verapamil (VERELAN PM) 24 hr capsule CP24 240 mg (has no administration in time  range)  buPROPion (WELLBUTRIN XL) 24 hr tablet 150 mg (has no administration in time range)  amitriptyline (ELAVIL) tablet 100 mg (has no administration in time range)  busPIRone (BUSPAR) tablet 15 mg (has no administration in time range)  donepezil (ARICEPT) tablet 5 mg (has no administration in time range)  sertraline (ZOLOFT) tablet 150 mg (has no administration in time range)  levothyroxine (SYNTHROID) tablet 50 mcg (has no administration in time range)  pantoprazole (PROTONIX) EC tablet 40 mg (has no administration in time range)  folic acid (FOLVITE) tablet 400 mcg (has no administration in time range)  rivaroxaban (XARELTO) tablet 20 mg (has no administration in time range)  carisoprodol (SOMA) tablet 350 mg (has no administration in time range)  ascorbic acid (VITAMIN C) tablet 500 mg (has no administration in time range)  Cholecalciferol 1,000 Units (has no administration in time range)  Omega-3 CAPS 1,000 mg (has no administration in time range)  albuterol (VENTOLIN HFA) 108 (90 Base) MCG/ACT inhaler 1-2 puff (has no administration in time range)  fluticasone furoate-vilanterol (BREO ELLIPTA) 100-25 MCG/INH 1 puff (has no administration in time range)  acetaminophen (TYLENOL) tablet 650 mg (has no administration in time range)  ondansetron (ZOFRAN) injection 4 mg (has no administration in time range)  insulin aspart (novoLOG) injection 0-15 Units (has no administration in time range)     IMPRESSION / MDM / ASSESSMENT AND PLAN / ED COURSE  I reviewed the triage vital signs and the nursing notes.  71 year old female with PMH as noted above presents with pressure-like chest pain and shortness of breath with minimal exertion and now sometimes at rest.  Physical exam is unremarkable for acute findings.  EKG is currently nonischemic.  Initial troponin is negative.  BMP and CBC show no acute findings.  Chest x-ray is also negative for acute findings.  Differential diagnosis includes,  but is not limited to, ACS including possible unstable angina, coronary vasospasm, intermittent arrhythmia.  The patient is not anemic.  Her workup so far does not suggest acute CHF.  There is no evidence of infection.  We will obtain serial troponins.  I will consult cardiology.  Patient's presentation is most consistent with acute presentation with potential threat to life or bodily function.  The patient is on the cardiac monitor to evaluate for evidence of arrhythmia and/or significant heart rate changes.   ----------------------------------------- 4:56 PM on 03/01/2022 -----------------------------------------  I consulted Dr. Quentin Ore from cardiology and discussed the case with him.  He agrees that the presentation is concerning for unstable angina and recommends admission with plan for a stress test likely tomorrow morning.  I then consulted Dr. Joylene Igo from the hospitalist service; based on discussion she agrees to admit the patient.   FINAL CLINICAL IMPRESSION(S) / ED DIAGNOSES   Final diagnoses:  Chest pain, unspecified type     Rx / DC Orders   ED Discharge Orders     None        Note:  This document was prepared using Dragon voice recognition software and may include unintentional dictation errors.    Dionne Bucy, MD 03/01/22 [id number]

## 2022-03-01 NOTE — Assessment & Plan Note (Signed)
Continue donepezil 

## 2022-03-01 NOTE — H&P (Addendum)
History and Physical    PatientOsa Hammond: Melanie Hammond:[id number]:[id number] DOB: 06/25/1950 DOA: 03/01/2022 DOS: the patient was seen and examined on 03/01/2022 PCP: Retia Passehung, So Yeon, MD  Patient coming from: Home  Chief Complaint:  Chief Complaint  Patient presents with   Chest Pain   HPI: Melanie Craveram Morrow is a 71 y.o. female with medical history significant for diabetes mellitus, hypertension, atrial flutter on anticoagulation therapy, Alzheimer's dementia, nicotine dependence who presents to the emergency room for evaluation of chest pain which she has had intermittently for several months.  According to the patient she develops chest pressure over the left anterior chest wall usually worse with exertion and relieved at rest associated with shortness of breath and nausea.  Her symptoms last few days and resolve but over the last several days her pain has been persistent prompting her visit to the emergency room. She rated her pain a 6 x 10 in intensity at its worst.  Pain was nonradiating and associated with nausea and shortness of breath but she denied having any palpitations or diaphoresis.  Pain was resolved by nitroglycerin and aspirin administered by EMS. She has been seen in the ER for chest pain evaluation several times but has never had a stress test. She denies having any cough, no fever, no chills, no abdominal pain, no headache, no dizziness, no lightheadedness, no shortness of breath, no blurred vision no focal deficit. Twelve-lead EKG showed sinus rhythm with LVH and a first-degree AV block She will be referred to observation status for further evaluation.    Review of Systems: As mentioned in the history of present illness. All other systems reviewed and are negative. Past Medical History:  Diagnosis Date   Diabetes mellitus without complication (HCC)    Hypertension    Past Surgical History:  Procedure Laterality Date   INTRAMEDULLARY (IM) NAIL INTERTROCHANTERIC Left 12/09/2019   Procedure:  INTRAMEDULLARY (IM) NAIL INTERTROCHANTRIC;  Surgeon: Christena FlakePoggi, John J, MD;  Location: [facility] ORS;  Service: Orthopedics;  Laterality: Left;   ORIF ANKLE FRACTURE Right 12/11/2019   Procedure: OPEN REDUCTION INTERNAL FIXATION (ORIF) ANKLE FRACTURE;  Surgeon: Christena FlakePoggi, John J, MD;  Location: [facility] ORS;  Service: Orthopedics;  Laterality: Right;   Social History:  reports that she has been smoking. She has a 5.00 pack-year smoking history. She has never used smokeless tobacco. She reports that she does not use drugs. No history on file for alcohol use.  Allergies  Allergen Reactions   Compazine [Prochlorperazine]    Thorazine [Chlorpromazine]     Family History  Problem Relation Age of Onset   Hypertension Mother    Hypertension Father     Prior to Admission medications   Medication Sig Start Date End Date Taking? Authorizing Provider  alendronate (FOSAMAX) 70 MG tablet Take 70 mg by mouth once a week. 02/10/22 02/10/23 Yes [provider]  amLODipine (NORVASC) 10 MG tablet Take 1 tablet by mouth daily. 07/04/21  Yes [provider]  mirtazapine (REMERON) 7.5 MG tablet Take 7.5 mg by mouth at bedtime. 02/10/22 02/10/23 Yes [provider]  naloxone (NARCAN) nasal spray 4 mg/0.1 mL Place 1 spray into the nose once. 12/30/20  Yes [provider]  ofloxacin (OCUFLOX) 0.3 % ophthalmic solution Place 1 drop into both eyes 4 (four) times daily. 02/10/22  Yes [provider]  oxyCODONE-acetaminophen (PERCOCET/ROXICET) 5-325 MG tablet Take 1 tablet by mouth every 8 (eight) hours as needed. 12/04/21 03/07/22 Yes [provider]  rosuvastatin (CRESTOR) 5 MG tablet Take  5 mg by mouth daily. 10/31/21 02/10/23 Yes [provider]  albuterol (VENTOLIN HFA) 108 (90 Base) MCG/ACT inhaler Inhale 1-2 puffs into the lungs every 6 (six) hours as needed for wheezing or shortness of breath.    [provider]  amitriptyline (ELAVIL) 100 MG tablet Take 100  mg by mouth at bedtime.    [provider]  ascorbic acid (VITAMIN C) 500 MG tablet Take 500 mg by mouth daily.    [provider]  aspirin 81 MG chewable tablet Chew 81 mg by mouth daily.    [provider]  bimatoprost (LUMIGAN) 0.03 % ophthalmic solution Apply 1 drop to eye every evening. Patient not taking: Reported on 12/09/2019    [provider]  buPROPion (WELLBUTRIN XL) 150 MG 24 hr tablet Take 150 mg by mouth daily.    [provider]  busPIRone (BUSPAR) 15 MG tablet Take 15 mg by mouth in the morning and at bedtime.    [provider]  carisoprodol (SOMA) 350 MG tablet Take 350 mg by mouth 3 (three) times daily. 10/16/19   [provider]  Cholecalciferol 25 MCG (1000 UT) capsule Take 1,000 Units by mouth daily.    [provider]  donepezil (ARICEPT) 5 MG tablet Take 5 mg by mouth every evening.    [provider]  dorzolamide-timolol (COSOPT) 2-0.5 % ophthalmic solution Place 1 drop into the left eye 2 (two) times daily.    [provider]  dorzolamidel-timolol (COSOPT) 22.3-6.8 MG/ML SOLN ophthalmic solution Place 1 drop into the left eye 2 (two) times daily.    [provider]  famotidine (PEPCID) 20 MG tablet Take 20 mg by mouth 2 (two) times daily.    [provider]  fluticasone furoate-vilanterol (BREO ELLIPTA) 100-25 MCG/INH AEPB Inhale 1 puff into the lungs daily. 01/15/17   [provider]  folic acid (FOLVITE) 800 MCG tablet Take 400 mcg by mouth daily.    [provider]  hydrALAZINE (APRESOLINE) 100 MG tablet Take 100 mg by mouth 3 (three) times daily.    [provider]  hydrochlorothiazide (HYDRODIURIL) 25 MG tablet Take 25 mg by mouth daily.    [provider]  hydrOXYzine (ATARAX/VISTARIL) 10 MG tablet Take 10-50 mg by mouth 2 (two) times daily as needed for anxiety.    [provider]  irbesartan (AVAPRO) 300 MG tablet  Take 300 mg by mouth daily.    [provider]  levothyroxine (SYNTHROID) 50 MCG tablet Take 50 mcg by mouth daily. 09/29/19   [provider]  metFORMIN (GLUCOPHAGE) 500 MG tablet Take 500 mg by mouth daily with breakfast. 10/04/19 10/03/20  [provider]  metoprolol tartrate (LOPRESSOR) 25 MG tablet Take 1 tablet (25 mg total) by mouth 2 (two) times daily. 12/14/19 01/13/20  Charise Killian, MD  Omega-3 1000 MG CAPS Take 1,000 mg by mouth daily.    [provider]  omeprazole (PRILOSEC) 20 MG capsule Take 20 mg by mouth daily.    [provider]  oxyCODONE (OXY IR/ROXICODONE) 5 MG immediate release tablet Take 1-2 tablets (5-10 mg total) by mouth every 4 (four) hours as needed for moderate pain (pain score 4-6). 12/13/19   Anson [place], PA-C  rivaroxaban (XARELTO) 20 MG TABS tablet Take 20 mg by mouth daily with breakfast. 09/29/19   [provider]  sertraline (ZOLOFT) 50 MG tablet Take 150 mg by mouth daily.     [provider]  spironolactone (ALDACTONE) 25 MG tablet Take 25 mg by mouth daily. 02/02/17   [provider]  traMADol (ULTRAM) 50 MG tablet Take 1 tablet (50 mg total) by mouth every 6 (six) hours as needed for moderate pain. 12/13/19   Anson [place], PA-C  traZODone (DESYREL) 100 MG tablet Take 100 mg by mouth in the morning and at bedtime.    [provider]  traZODone (DESYREL) 150 MG tablet Take 150 mg by mouth at bedtime.    [provider]  verapamil (VERELAN PM) 240 MG 24 hr capsule Take 240 mg by mouth in the morning and at bedtime.    [provider]    Physical Exam: Vitals:   03/01/22 1423 03/01/22 1426  BP: (!) 151/80   Pulse: 84   Resp: 17   Temp: 98.2 F (36.8 C)   TempSrc: Oral   SpO2: 99%   Weight:  84.4 kg   Physical Exam Vitals and nursing note reviewed.  Constitutional:      Appearance: She is well-developed.  Eyes:     Pupils: Pupils are  equal, round, and reactive to light.  Cardiovascular:     Rate and Rhythm: Normal rate and regular rhythm.     Heart sounds: Normal heart sounds.  Pulmonary:     Effort: Pulmonary effort is normal.  Abdominal:     General: Bowel sounds are normal.     Palpations: Abdomen is soft.  Musculoskeletal:        General: Normal range of motion.     Cervical back: Normal range of motion and neck supple.  Skin:    General: Skin is warm and dry.  Neurological:     General: No focal deficit present.     Mental Status: She is alert.  Psychiatric:        Mood and Affect: Mood normal.        Behavior: Behavior normal.     Data Reviewed: Relevant notes from primary care and specialist visits, past discharge summaries as available in EHR, including Care Everywhere. Prior diagnostic testing as pertinent to current admission diagnoses Updated medications and problem lists for reconciliation ED course, including vitals, labs, imaging, treatment and response to treatment Triage notes, nursing and pharmacy notes and ED provider's notes Notable results as noted in HPI Labs reviewed.  Sodium 132, potassium 3.8, chloride 112, bicarb 20, glucose 163, BUN 12, creatinine 0.72, calcium 9.7, troponin 4, white count 8.0, hemoglobin 13.7, hematocrit 41.5, platelet count 204 Chest x-ray shows no evidence of acute cardiopulmonary disease There are no new results to review at this time.  Assessment and Plan: * Chest pain Patient presents for evaluation of exertional chest pain associated with shortness of breath and nausea Cardiac risk factors include diabetes mellitus, hypertension and nicotine dependence Cycle troponin Stress test for risk factor stratification Consult cardiology Continue aspirin and metoprolol   Hypothyroidism Continue Synthroid  Depression Continue bupropion, buspirone and sertraline  Dementia without behavioral disturbance (HCC) Continue donepezil  Atrial flutter  (HCC) Continue metoprolol for rate control Continue Xarelto as primary prophylaxis for an acute stroke  Essential hypertension Continue metoprolol, verapamil, hydrochlorothiazide, spironolactone and Avapro  Nicotine dependence Smoking cessation discussed with patient in detail She declines a nicotine transdermal patch  Diabetes mellitus (HCC) Hold metformin Maintain consistent carbohydrate diet Glycemic control with sliding scale insulin      Advance Care Planning:   Code Status: Full Code   Consults: Cardiologist  Family Communication: Greater than 50% of  time was spent discussing patient's condition and plan of care with her at the bedside.  All questions and concerns have been addressed.  She verbalizes understanding and agrees with the plan.  Severity of Illness: The appropriate patient status for this patient is OBSERVATION. Observation status is judged to be reasonable and necessary in order to provide the required intensity of service to ensure the patient's safety. The patient's presenting symptoms, physical exam findings, and initial radiographic and laboratory data in the context of their medical condition is felt to place them at decreased risk for further clinical deterioration. Furthermore, it is anticipated that the patient will be medically stable for discharge from the hospital within 2 midnights of admission.   Author: Lucile Shutters, MD 03/01/2022 5:05 PM  For on call review [email or web address].

## 2022-03-01 NOTE — Assessment & Plan Note (Signed)
Hold metformin Maintain consistent carbohydrate diet Glycemic control with sliding scale insulin 

## 2022-03-01 NOTE — Assessment & Plan Note (Signed)
Continue Synthroid °

## 2022-03-01 NOTE — Assessment & Plan Note (Addendum)
Paroxysmal Continue metoprolol for rate control Continue Xarelto as primary prophylaxis for an acute stroke 

## 2022-03-01 NOTE — Assessment & Plan Note (Signed)
Smoking cessation discussed with patient in detail. ?She declines a nicotine transdermal patch. ?

## 2022-03-02 ENCOUNTER — Observation Stay (HOSPITAL_COMMUNITY): Payer: Medicare HMO

## 2022-03-02 ENCOUNTER — Inpatient Hospital Stay (HOSPITAL_BASED_OUTPATIENT_CLINIC_OR_DEPARTMENT_OTHER)
Admit: 2022-03-02 | Discharge: 2022-03-02 | Disposition: A | Discharge status: Home / Self Care | Payer: Medicare HMO | Attending: Internal Medicine | Admitting: Internal Medicine

## 2022-03-02 DIAGNOSIS — I4892 Unspecified atrial flutter: Secondary | ICD-10-CM | POA: Diagnosis not present

## 2022-03-02 DIAGNOSIS — Z720 Tobacco use: Secondary | ICD-10-CM

## 2022-03-02 DIAGNOSIS — K219 Gastro-esophageal reflux disease without esophagitis: Secondary | ICD-10-CM

## 2022-03-02 DIAGNOSIS — R079 Chest pain, unspecified: Secondary | ICD-10-CM | POA: Diagnosis not present

## 2022-03-02 DIAGNOSIS — I200 Unstable angina: Secondary | ICD-10-CM | POA: Diagnosis not present

## 2022-03-02 DIAGNOSIS — I10 Essential (primary) hypertension: Secondary | ICD-10-CM

## 2022-03-02 LAB — NM MYOCAR MULTI W/SPECT W/WALL MOTION / EF
LV dias vol: 48 mL (ref 46–106)
LV sys vol: 16 mL
Nuc Stress EF: 67 %
Peak HR: 88 {beats}/min
Rest HR: 65 {beats}/min
Rest Nuclear Isotope Dose: 11 mCi
SDS: 0
SRS: 1
SSS: 1
Stress Nuclear Isotope Dose: 33.1 mCi
TID: 0.68

## 2022-03-02 LAB — ECHOCARDIOGRAM COMPLETE
AR max vel: 2.26 cm2
AV Area VTI: 1.98 cm2
AV Area mean vel: 2.11 cm2
AV Mean grad: 3 mmHg
AV Peak grad: 6.1 mmHg
Ao pk vel: 1.23 m/s
Area-P 1/2: 5.62 cm2
S' Lateral: 2.6 cm
Weight: 2976 oz

## 2022-03-02 LAB — HEMOGLOBIN A1C
Hgb A1c MFr Bld: 6.5 % — ABNORMAL HIGH (ref 4.8–5.6)
Mean Plasma Glucose: 140 mg/dL

## 2022-03-02 LAB — CBG MONITORING, ED
Glucose-Capillary: 110 mg/dL — ABNORMAL HIGH (ref 70–99)
Glucose-Capillary: 167 mg/dL — ABNORMAL HIGH (ref 70–99)
Glucose-Capillary: 124 mg/dL — ABNORMAL HIGH (ref 70–99)

## 2022-03-02 LAB — HIV ANTIBODY (ROUTINE TESTING W REFLEX): HIV Screen 4th Generation wRfx: NONREACTIVE

## 2022-03-02 MED ORDER — REGADENOSON 0.4 MG/5ML IV SOLN
0.4000 mg | Freq: Once | INTRAVENOUS | Status: AC
  Administered 2022-03-02: 0.4 mg via INTRAVENOUS

## 2022-03-02 MED ORDER — TECHNETIUM TC 99M TETROFOSMIN IV KIT
33.1000 | PACK | Freq: Once | INTRAVENOUS | Status: AC | PRN
  Administered 2022-03-02: 33.1 via INTRAVENOUS

## 2022-03-02 MED ORDER — TECHNETIUM TC 99M TETROFOSMIN IV KIT
11.0000 | PACK | Freq: Once | INTRAVENOUS | Status: AC | PRN
  Administered 2022-03-02: 11 via INTRAVENOUS

## 2022-03-02 MED ORDER — OMEGA-3-ACID ETHYL ESTERS 1 G PO CAPS
1.0000 g | ORAL_CAPSULE | Freq: Every day | ORAL | Status: DC
  Administered 2022-03-02: 1 g via ORAL
  Filled 2022-03-02: qty 1

## 2022-03-02 MED ORDER — VITAMIN D 25 MCG (1000 UNIT) PO TABS
1000.0000 [IU] | ORAL_TABLET | Freq: Every day | ORAL | Status: DC
  Administered 2022-03-02: 1000 [IU] via ORAL
  Filled 2022-03-02: qty 1

## 2022-03-02 NOTE — Care Management Obs Status (Signed)
MEDICARE OBSERVATION STATUS NOTIFICATION   Patient Details  Name: Melanie Hammond MRN: [id number] Date of Birth: 1950/09/10   Medicare Observation Status Notification Given:  Yes    Allayne Butcher, RN 03/02/2022, 4:28 PM

## 2022-03-02 NOTE — Progress Notes (Signed)
Cardiology Consultation   Patient ID: Melanie Hammond MRN: [id number]; DOB: 1951-02-14  Admit date: 03/01/2022 Date of Consult: 03/02/2022  PCP:  Retia Passe, MD   [facility] Providers Cardiologist:New  Patient Profile:   Ashelynn Hammond is a 71 y.o. female with a hx of typical aflutter s/p ablation in 2018, wide complex tachycardia felt to be SVT rather than SVT, HTN, mitral valve prolapse, GERD, hypothyroidism, metastatic squamous cell cancer, ILD GAD, Alzheimer's, tobacco use who is being seen 03/02/2022 for the evaluation of chest pain at the request of Dr. Dareen Piano.  History of Present Illness:   [name] was previously seen by outside hospital. She has a history of atrial flutter. EP study in December 20187 showed inducible aflutter with successful ablation. There was no inducible VT, no accessory pathway, dual AV nodal physiology but no inducible AV node reentrant tachycardia. She is on Xarelto for stroke ppx. She also has a h/o PFO. She has history of difficult to control HTN on multiple medications.   Echo 07/2020 showed LVEF 60%, G1DD, mild MR, mild TR, RVSP  The patient smokes 1 ppd. NO alochol or drug history. She lives with her daughter, functional at baseline.  The patient presented to the ER for chest pain and SOB. Symptoms were progressively worsening for the last week. She reports symptoms are worse with exertion. She feels fine at rest. She reported nasuea and vomiting two nights ago. NO radiation of the pain. EMS was called and she was given ASA and NTG with improvement of pain.  In the ER HS trop negative x 4. BP 151/80. EKG with NSR with minimal ST and T wave changes inferolateral leads. Sodium 132. CXR was non-acute. The patient was admitted for further work-up.  Past Medical History:  Diagnosis Date   Diabetes mellitus without complication (HCC)    Hypertension     Past Surgical History:  Procedure Laterality Date   INTRAMEDULLARY (IM) NAIL  INTERTROCHANTERIC Left 12/09/2019   Procedure: INTRAMEDULLARY (IM) NAIL INTERTROCHANTRIC;  Surgeon: Christena Flake, MD;  Location: [facility] ORS;  Service: Orthopedics;  Laterality: Left;   ORIF ANKLE FRACTURE Right 12/11/2019   Procedure: OPEN REDUCTION INTERNAL FIXATION (ORIF) ANKLE FRACTURE;  Surgeon: Christena Flake, MD;  Location: [facility] ORS;  Service: Orthopedics;  Laterality: Right;     Home Medications:  Prior to Admission medications   Medication Sig Start Date End Date Taking? Authorizing Provider  alendronate (FOSAMAX) 70 MG tablet Take 70 mg by mouth once a week. 02/10/22 02/10/23 Yes [provider]  amitriptyline (ELAVIL) 100 MG tablet Take 100 mg by mouth at bedtime.   Yes [provider]  amLODipine (NORVASC) 10 MG tablet Take 1 tablet by mouth daily. 07/04/21  Yes [provider]  ascorbic acid (VITAMIN C) 500 MG tablet Take 500 mg by mouth daily.   Yes [provider]  aspirin 81 MG chewable tablet Chew 81 mg by mouth daily.   Yes [provider]  bimatoprost (LUMIGAN) 0.03 % ophthalmic solution Apply 1 drop to eye every evening.   Yes [provider]  buPROPion (WELLBUTRIN XL) 150 MG 24 hr tablet Take 150 mg by mouth daily.   Yes [provider]  busPIRone (BUSPAR) 15 MG tablet Take 15 mg by mouth in the morning and at bedtime.   Yes [provider]  carisoprodol (SOMA) 350 MG tablet Take 350 mg by mouth 3 (three) times daily. 10/16/19  Yes [provider]  Cholecalciferol  25 MCG (1000 UT) capsule Take 1,000 Units by mouth daily.   Yes [provider]  donepezil (ARICEPT) 5 MG tablet Take 5 mg by mouth every evening.   Yes [provider]  dorzolamidel-timolol (COSOPT) 22.3-6.8 MG/ML SOLN ophthalmic solution Place 1 drop into both eyes 2 (two) times daily.   Yes [provider]  famotidine (PEPCID) 20 MG tablet Take 20 mg by mouth 2 (two) times daily.   Yes [provider]   fluticasone furoate-vilanterol (BREO ELLIPTA) 100-25 MCG/INH AEPB Inhale 1 puff into the lungs daily. 01/15/17  Yes [provider]  folic acid (FOLVITE) 800 MCG tablet Take 400 mcg by mouth daily.   Yes [provider]  irbesartan (AVAPRO) 300 MG tablet Take 300 mg by mouth daily.   Yes [provider]  levothyroxine (SYNTHROID) 50 MCG tablet Take 50 mcg by mouth daily. 09/29/19  Yes [provider]  metFORMIN (GLUCOPHAGE) 500 MG tablet Take 500 mg by mouth daily with breakfast. 10/04/19 03/01/22 Yes [provider]  mirtazapine (REMERON) 7.5 MG tablet Take 7.5 mg by mouth at bedtime. 02/10/22 02/10/23 Yes [provider]  naloxone (NARCAN) nasal spray 4 mg/0.1 mL Place 1 spray into the nose once. 12/30/20  Yes [provider]  ofloxacin (OCUFLOX) 0.3 % ophthalmic solution Place 1 drop into both eyes 4 (four) times daily. 02/10/22  Yes [provider]  Omega-3 1000 MG CAPS Take 1,000 mg by mouth daily.   Yes [provider]  omeprazole (PRILOSEC) 20 MG capsule Take 20 mg by mouth daily.   Yes [provider]  oxyCODONE-acetaminophen (PERCOCET/ROXICET) 5-325 MG tablet Take 1 tablet by mouth every 8 (eight) hours as needed. 12/04/21 03/07/22 Yes [provider]  rivaroxaban (XARELTO) 20 MG TABS tablet Take 20 mg by mouth daily with breakfast. 09/29/19  Yes [provider]  rosuvastatin (CRESTOR) 5 MG tablet Take 5 mg by mouth daily. 10/31/21 02/10/23 Yes [provider]  sertraline (ZOLOFT) 50 MG tablet Take 150 mg by mouth daily.    Yes [provider]  traZODone (DESYREL) 150 MG tablet Take 150 mg by mouth at bedtime.   Yes [provider]  albuterol (VENTOLIN HFA) 108 (90 Base) MCG/ACT inhaler Inhale 1-2 puffs into the lungs every 6 (six) hours as needed for wheezing or shortness of breath.    [provider]  dorzolamide-timolol (COSOPT) 2-0.5 % ophthalmic  solution Place 1 drop into the left eye 2 (two) times daily. Patient not taking: Reported on 03/01/2022    [provider]  hydrALAZINE (APRESOLINE) 100 MG tablet Take 100 mg by mouth 3 (three) times daily. Patient not taking: Reported on 03/01/2022    [provider]  hydrochlorothiazide (HYDRODIURIL) 25 MG tablet Take 25 mg by mouth daily. Patient not taking: Reported on 03/01/2022    [provider]  hydrOXYzine (ATARAX/VISTARIL) 10 MG tablet Take 10-50 mg by mouth 2 (two) times daily as needed for anxiety. Patient not taking: Reported on 03/01/2022    [provider]  metoprolol tartrate (LOPRESSOR) 25 MG tablet Take 1 tablet (25 mg total) by mouth 2 (two) times daily. 12/14/19 01/13/20  Charise KillianWilliams, Jamiese M, MD  oxyCODONE (OXY IR/ROXICODONE) 5 MG immediate release tablet Take 1-2 tablets (5-10 mg total) by mouth every 4 (four) hours as needed for moderate pain (pain score 4-6). Patient not taking: Reported on 03/01/2022 12/13/19   Anson OregonMcGhee, James Lance, PA-C  spironolactone (ALDACTONE) 25 MG tablet Take 25 mg by  mouth daily. Patient not taking: Reported on 03/01/2022 02/02/17   [provider]  traMADol (ULTRAM) 50 MG tablet Take 1 tablet (50 mg total) by mouth every 6 (six) hours as needed for moderate pain. Patient not taking: Reported on 03/01/2022 12/13/19   Anson Oregon, PA-C  traZODone (DESYREL) 100 MG tablet Take 100 mg by mouth in the morning and at bedtime. Patient not taking: Reported on 03/01/2022    [provider]  verapamil (VERELAN PM) 240 MG 24 hr capsule Take 240 mg by mouth in the morning and at bedtime. Patient not taking: Reported on 03/01/2022    [provider]    Inpatient Medications: Scheduled Meds:  amitriptyline  100 mg Oral QHS   ascorbic acid  500 mg Oral Daily   aspirin  81 mg Oral Daily   buPROPion  150 mg Oral Daily   busPIRone  15 mg Oral BID   carisoprodol  350 mg Oral TID    cholecalciferol  1,000 Units Oral Daily   donepezil  5 mg Oral QPM   fluticasone furoate-vilanterol  1 puff Inhalation Daily   folic acid  500 mcg Oral Daily   insulin aspart  0-15 Units Subcutaneous TID WC   irbesartan  300 mg Oral Daily   levothyroxine  50 mcg Oral Daily   metoprolol tartrate  25 mg Oral BID   omega-3 acid ethyl esters  1 g Oral Daily   pantoprazole  40 mg Oral Daily   rivaroxaban  20 mg Oral Q breakfast   sertraline  150 mg Oral Daily   Continuous Infusions:  PRN Meds: acetaminophen, albuterol, ondansetron (ZOFRAN) IV  Allergies:    Allergies  Allergen Reactions   Compazine [Prochlorperazine]    Thorazine [Chlorpromazine]     Social History:   Social History   Socioeconomic History   Marital status: Single    Spouse name: Not on file   Number of children: Not on file   Years of education: Not on file   Highest education level: Not on file  Occupational History   Not on file  Tobacco Use   Smoking status: Every Day    Packs/day: 0.50    Years: 10.00    Total pack years: 5.00    Types: Cigarettes   Smokeless tobacco: Never  Substance and Sexual Activity   Alcohol use: Not on file   Drug use: Never   Sexual activity: Not on file  Other Topics Concern   Not on file  Social History Narrative   Not on file   Social Determinants of Health   Financial Resource Strain: Not on file  Food Insecurity: Not on file  Transportation Needs: Not on file  Physical Activity: Not on file  Stress: Not on file  Social Connections: Not on file  Intimate Partner Violence: Not on file    Family History:    Family History  Problem Relation Age of Onset   Hypertension Mother    Hypertension Father      ROS:  Please see the history of present illness.   All other ROS reviewed and negative.     Physical Exam/Data:   Vitals:   03/01/22 1423 03/01/22 1426 03/02/22 0402  BP: (!) 151/80  133/66  Pulse: 84  63  Resp: 17  18  Temp: 98.2 F (36.8 C)   97.8 F (36.6 C)  TempSrc: Oral  Oral  SpO2: 99%  96%  Weight:  84.4 kg    No  intake or output data in the 24 hours ending 03/02/22 0822    03/01/2022    2:26 PM 12/09/2019    9:35 AM  Last 3 Weights  Weight (lbs) 186 lb 192 lb  Weight (kg) 84.369 kg 87.091 kg     Body mass index is 28.28 kg/m.  General:  Well nourished, well developed, in no acute distress HEENT: normal Neck: no JVD Vascular: No carotid bruits; Distal pulses 2+ bilaterally Cardiac:  normal S1, S2; RRR; no murmur  Lungs:  +wheezing Abd: soft, nontender, no hepatomegaly  Ext: no edema Musculoskeletal:  No deformities, BUE and BLE strength normal and equal Skin: warm and dry  Neuro:  CNs 2-12 intact, no focal abnormalities noted Psych:  Normal affect   EKG:  The EKG was personally reviewed and demonstrates:  NSR 1st degree AV block, 89bpm, minimal  ST depression/T wave changes inferolateral leads Telemetry:  Telemetry was personally reviewed and demonstrates:  SB/SR HR 50-60s, brief SVT  Relevant CV Studies:  Echo ordered Myoview lexiscan ordered  Echo 2020                                                                        1.The estimated LV ejection fraction is 60 %                                                                                                  2.There are no wall motion abnormalities observed                                                                                             3.Normal left atrial pressure with Grade I diastolic dysfunction                                                                              4.There is mild mitral regurgitation  5.There is mild tricuspid regurgitation                                                                                                       6.Estimated RVSP is 32 mmHg                                                                                                                    7.Compared to the prior study dated 07/28/2016, there is no            significant change                                                                                              Laboratory Data:  High Sensitivity Troponin:   Recent Labs  Lab 03/01/22 1429 03/01/22 1628 03/01/22 1748 03/01/22 2155  TROPONINIHS Chemistry Recent Labs  Lab 03/01/22 1429  NA 132*  K 3.8  CL 102  CO2 20*  GLUCOSE 163*  BUN 12  CREATININE 0.72  CALCIUM 9.7  GFRNONAA >60  ANIONGAP 10    No results for input(s): "PROT", "ALBUMIN", "AST", "ALT", "ALKPHOS", "BILITOT" in the last 168 hours. Lipids No results for input(s): "CHOL", "TRIG", "HDL", "LABVLDL", "LDLCALC", "CHOLHDL" in the last 168 hours.  Hematology Recent Labs  Lab 03/01/22 1429  WBC 8.0  RBC 4.76  HGB 13.7  HCT 41.5  MCV 87.2  MCH 28.8  MCHC 33.0  RDW 15.6*  PLT 204   Thyroid No results for input(s): "TSH", "FREET4" in the last 168 hours.  BNPNo results for input(s): "BNP", "PROBNP" in the last 168 hours.  DDimer No results for input(s): "DDIMER" in the last 168 hours.   Radiology/Studies:  DG Chest 2 View  Result Date: 03/01/2022 CLINICAL DATA:  Chest pain for the last 6 months on and off. EXAM: CHEST - 2 VIEW COMPARISON:  12/09/2019. FINDINGS: Cardiac silhouette normal in size. No mediastinal or hilar masses. No evidence of adenopathy. Clear lungs.  No pleural effusion or pneumothorax. Right anterolateral chest wall/axillary vascular clips, stable. Skeletal structures are intact. IMPRESSION: No active cardiopulmonary disease. Electronically Signed   By: Renard Hamper.D.  On: 03/01/2022 14:46     Assessment and Plan:   Chest pain/SOB - presented with chest pressure and SOB for the last week - HS trop negative x 4 - EKG with NSR and minimal ST depression and TW changes inferolaterally  - echo ordered - plan for Myovioew lexiscan today -  PTA ASA and Xarelto. Likely only needs Xarelto - continue Lopressor. PTA Crestor 5 mg daily  HTN - PTA amlodipine 10mg  daily, Hydralazine 100mg  TID, irbesartan 300mg  daily, Lopressor 25mg BID, spironolactone 25mg  daily, verapamil 240mg  daily, HCTZ 25mg  daily - patient has h/o difficult to control high BP - continue irbesartan and lopressor. Restart other home meds as able  Aflutter s/p ablation - ablation in 2018 - PTA Xarelto - continue Lopressor for rate control  COPD/tobacco use - smokes 1ppd - cessation advised  Dementia - A&Ox3  HLD - LDL 28 11/2021 - PTA Crestor 5mg  daily  For questions or updates, please contact [facility] Please consult [email or web address] for contact info under    Signed, Saamir Armstrong , PA-C  03/02/2022 8:22 AM

## 2022-03-02 NOTE — TOC Initial Note (Signed)
Transition of Care [facility]) - Initial/Assessment Note    Patient Details  Name: Melanie Hammond MRN: [id number] Date of Birth: Aug 23, 1950  [facility]) CM/SW Contact:    Allayne Butcher, RN Phone Number: 03/02/2022, 4:27 PM  Clinical Narrative:                 Patient admitted for chest pain but changed to observation today and ready for discharge.  RNCM called and spoke with patient's daughter, Hospital doctor via phone and reviewed MOON.  Patient lives with her daughter and her family.  Daughter's husband will be coming to pick the patient up after 6 pm tonight.    Expected Discharge Plan: Home/Self Care Barriers to Discharge: Barriers Resolved   Patient Goals and CMS Choice Patient states their goals for this hospitalization and ongoing recovery are:: wants to know why she still feels bad      Expected Discharge Plan and Services Expected Discharge Plan: Home/Self Care       Living arrangements for the past 2 months: Single Family Home Expected Discharge Date: 03/02/22                                    Prior Living Arrangements/Services Living arrangements for the past 2 months: Single Family Home Lives with:: Adult Children Patient language and need for interpreter reviewed:: Yes Do you feel safe going back to the place where you live?: Yes      Need for Family Participation in Patient Care: Yes (Comment) Care giver support system in place?: Yes (comment)   Criminal Activity/Legal Involvement Pertinent to Current Situation/Hospitalization: No - Comment as needed  Activities of Daily Living      Permission Sought/Granted      Share Information with NAME: Mateo Flow     Permission granted to share info w Relationship: daughter  Permission granted to share info w Contact Information: [phone]  Emotional Assessment Appearance:: Appears stated age Attitude/Demeanor/Rapport: Engaged Affect (typically observed): Accepting Orientation: : Oriented to  Self, Oriented to Place, Oriented to  Time, Oriented to Situation Alcohol / Substance Use: Not Applicable Psych Involvement: No (comment)  Admission diagnosis:  Chest pain [R07.9] Patient Active Problem List   Diagnosis Date Noted   Primary hypertension 03/02/2022   Chest pain 03/01/2022   Atrial flutter (HCC) 03/01/2022   Dementia without behavioral disturbance (HCC) 03/01/2022   Depression 03/01/2022   Hypothyroidism 03/01/2022   Unstable angina (HCC) 03/01/2022   Intertrochanteric fracture of left hip (HCC) 12/09/2019   Diabetes mellitus (HCC) 12/09/2019   Nicotine dependence 12/09/2019   Essential hypertension 12/09/2019   Obesity (BMI 30-39.9) 12/09/2019   Fall 12/09/2019   PCP:  Retia Passe, MD Pharmacy:   [facility] [phone] - [place], [place] - [address] AT [facility] OF [address] [place] [address] [place] [address] Phone: [phone] Fax: [phone]     Social Determinants of Health (SDOH) Interventions    Readmission Risk Interventions     No data to display

## 2022-03-02 NOTE — Discharge Summary (Signed)
Physician Discharge Summary  Patient: Melanie Hammond DUK:[id number] DOB: [birth year]   Code Status: Full Code Admit date: 03/01/2022 Discharge date: 03/02/2022 Disposition: Home, No home health services recommended PCP: Karie Kirks, MD  Recommendations for Outpatient Follow-up:  Follow up with PCP within 1-2 weeks Regarding general hospital follow up and preventative care Recommend further evaluation of alternate causes of chest discomfort including GERD evaluation Follow up with cardiology  Recommend discussing anticoagulation needs as she is s/p ablation for atrial flutter Evaluate BP with several discontinued medications for BP WNL or borderline low as listed below  Discharge Diagnoses:  Principal Problem:   Unstable angina ([place]) Active Problems:   Chest pain   Diabetes mellitus ([place])   Nicotine dependence   Essential hypertension   Atrial flutter ([place])   Dementia without behavioral disturbance ([place])   Depression   Hypothyroidism   Primary hypertension  Brief Hospital Course Summary: Melanie Hammond is a 71 y.o. female with a PMH significant for diabetes mellitus, hypertension, atrial flutter on anticoagulation therapy, Alzheimer's dementia, nicotine dependence.   They presented from home to the ED on 03/01/2022 with chest main intermittently x several months. Relieved with rest and worse with exertion. Has associated nausea,SOB. Received nitro and ASA via EMS which resolved pain.   In the ED, it was found that they had stable vital signs with elevated BP to 151/80.  Significant findings included ECG with LVH and 1st degree AV block.  Sodium 132, potassium 3.8, chloride 112, bicarb 20, glucose 163, BUN 12, creatinine 0.72, calcium 9.7, troponin 4, white count 8.0, hemoglobin 13.7, hematocrit 41.5, platelet count 204 Chest x-ray shows no evidence of acute cardiopulmonary disease   They were initially treated with home metoprolol. Cardiology was consulted for evaluation of  chest pain.   She underwent echo and lexiscan myoview which did not show signs of ischemia. Normal heart function. ACS was ruled out.  Her chest pain was ruled as non-cardiac and no other grave explanation. Her symptoms improved but remained present. Her vital signs remained stable throughout continuous cardiac monitoring.  Alternate explanations to be explored by PCP outpatient.   Additionally, multiple antihypertensives were discontinued at discharge, as listed below for BP WNL.  All other chronic conditions were treated with home medications.   Discharge Condition: Good, improved Recommended discharge diet: Regular healthy diet  Consultations: Cardiology   Procedures/Studies: Lexiscan myoview echo  Allergies as of 03/02/2022       Reactions   Compazine [prochlorperazine]    Thorazine [chlorpromazine]         Medication List     STOP taking these medications    hydrALAZINE 100 MG tablet Commonly known as: APRESOLINE   hydrochlorothiazide 25 MG tablet Commonly known as: HYDRODIURIL   hydrOXYzine 10 MG tablet Commonly known as: ATARAX   oxyCODONE 5 MG immediate release tablet Commonly known as: Oxy IR/ROXICODONE   spironolactone 25 MG tablet Commonly known as: ALDACTONE   traMADol 50 MG tablet Commonly known as: ULTRAM   verapamil 240 MG 24 hr capsule Commonly known as: VERELAN PM       TAKE these medications    alendronate 70 MG tablet Commonly known as: FOSAMAX Take 70 mg by mouth once a week.   amitriptyline 100 MG tablet Commonly known as: ELAVIL Take 100 mg by mouth at bedtime.   amLODipine 10 MG tablet Commonly known as: NORVASC Take 1 tablet by mouth daily.   ascorbic acid 500 MG tablet Commonly known as: VITAMIN C  Take 500 mg by mouth daily.   aspirin 81 MG chewable tablet Chew 81 mg by mouth daily.   bimatoprost 0.03 % ophthalmic solution Commonly known as: LUMIGAN Apply 1 drop to eye every evening.   buPROPion 150 MG 24 hr  tablet Commonly known as: WELLBUTRIN XL Take 150 mg by mouth daily.   busPIRone 15 MG tablet Commonly known as: BUSPAR Take 15 mg by mouth in the morning and at bedtime.   carisoprodol 350 MG tablet Commonly known as: SOMA Take 350 mg by mouth 3 (three) times daily.   Cholecalciferol 25 MCG (1000 UT) capsule Take 1,000 Units by mouth daily.   donepezil 5 MG tablet Commonly known as: ARICEPT Take 5 mg by mouth every evening.   dorzolamidel-timolol 22.3-6.8 MG/ML Soln ophthalmic solution Commonly known as: COSOPT Place 1 drop into both eyes 2 (two) times daily. What changed: Another medication with the same name was removed. Continue taking this medication, and follow the directions you see here.   famotidine 20 MG tablet Commonly known as: PEPCID Take 20 mg by mouth 2 (two) times daily.   fluticasone furoate-vilanterol 100-25 MCG/INH Aepb Commonly known as: BREO ELLIPTA Inhale 1 puff into the lungs daily.   folic acid 607 MCG tablet Commonly known as: FOLVITE Take 400 mcg by mouth daily.   irbesartan 300 MG tablet Commonly known as: AVAPRO Take 300 mg by mouth daily.   levothyroxine 50 MCG tablet Commonly known as: SYNTHROID Take 50 mcg by mouth daily.   metFORMIN 500 MG tablet Commonly known as: GLUCOPHAGE Take 500 mg by mouth daily with breakfast.   metoprolol tartrate 25 MG tablet Commonly known as: LOPRESSOR Take 1 tablet (25 mg total) by mouth 2 (two) times daily.   mirtazapine 7.5 MG tablet Commonly known as: REMERON Take 7.5 mg by mouth at bedtime.   naloxone 4 MG/0.1ML Liqd nasal spray kit Commonly known as: NARCAN Place 1 spray into the nose once.   ofloxacin 0.3 % ophthalmic solution Commonly known as: OCUFLOX Place 1 drop into both eyes 4 (four) times daily.   Omega-3 1000 MG Caps Take 1,000 mg by mouth daily.   oxyCODONE-acetaminophen 5-325 MG tablet Commonly known as: PERCOCET/ROXICET Take 1 tablet by mouth every 8 (eight) hours as  needed.   PriLOSEC 20 MG capsule Generic drug: omeprazole Take 20 mg by mouth daily.   rivaroxaban 20 MG Tabs tablet Commonly known as: XARELTO Take 20 mg by mouth daily with breakfast.   rosuvastatin 5 MG tablet Commonly known as: CRESTOR Take 5 mg by mouth daily.   sertraline 50 MG tablet Commonly known as: ZOLOFT Take 150 mg by mouth daily.   traZODone 150 MG tablet Commonly known as: DESYREL Take 150 mg by mouth at bedtime. What changed: Another medication with the same name was removed. Continue taking this medication, and follow the directions you see here.   Ventolin HFA 108 (90 Base) MCG/ACT inhaler Generic drug: albuterol Inhale 1-2 puffs into the lungs every 6 (six) hours as needed for wheezing or shortness of breath.         Subjective   Pt reports mild weird feeling of chest without pain. Denies SOB or chest pain with exertion in short distances she's ambulated in hospital.   All questions and concerns were addressed at time of discharge.  Objective  Blood pressure (!) 156/81, pulse 70, temperature 98.4 F (36.9 C), temperature source Oral, resp. rate 20, weight 84.4 kg, SpO2 95 %.   General:  Pt is alert, awake, not in acute distress Cardiovascular: RRR, S1/S2 +, no rubs, no gallops Respiratory: CTA bilaterally, no wheezing, no rhonchi Abdominal: Soft, NT, ND, bowel sounds + Extremities: no edema, no cyanosis  The results of significant diagnostics from this hospitalization (including imaging, microbiology, ancillary and laboratory) are listed below for reference.   Imaging studies: NM Myocar Multi W/Spect W/Wall Motion / EF  Result Date: 03/02/2022   The study is normal. The study is low risk.   ST depression in the lateral leads (I, aVL, V5 and V6) was noted.   LV perfusion is normal. There is no evidence of ischemia. There is no evidence of infarction.   Left ventricular function is normal. End diastolic cavity size is normal. End systolic cavity  size is normal.   CT attenuation images with mild aortic and coronary calcifications.   ECHOCARDIOGRAM COMPLETE  Result Date: 03/02/2022    ECHOCARDIOGRAM REPORT   Patient Name:   Layloni Ehrmann Date of Exam: 03/02/2022 Medical Rec #:  [id number]   Height:       68.0 in Accession #:    [id number]  Weight:       186.0 lb Date of Birth:  05/16/1950    BSA:          1.982 m Patient Age:    71 years    BP:           133/66 mmHg Patient Gender: F           HR:           73 bpm. Exam Location:  [facility] Procedure: 2D Echo, Color Doppler and Cardiac Doppler Indications:     R07.9 Chest Pain  History:         Patient has no prior history of Echocardiogram examinations.                  Risk Factors:Hypertension and Diabetes.  Sonographer:     Charmayne Sheer Referring Phys:  [place] Diagnosing Phys: Kathlyn Sacramento MD  Sonographer Comments: Suboptimal apical window and no subcostal window. Image acquisition challenging due to breast implants. IMPRESSIONS  1. Left ventricular ejection fraction, by estimation, is 55 to 60%. The left ventricle has normal function. Left ventricular endocardial border not optimally defined to evaluate regional wall motion. There is mild left ventricular hypertrophy. Left ventricular diastolic parameters are consistent with Grade I diastolic dysfunction (impaired relaxation).  2. Right ventricular systolic function is normal. The right ventricular size is normal. Tricuspid regurgitation signal is inadequate for assessing PA pressure.  3. The mitral valve is normal in structure. No evidence of mitral valve regurgitation. No evidence of mitral stenosis.  4. The aortic valve is normal in structure. Aortic valve regurgitation is not visualized. No aortic stenosis is present.  5. The inferior vena cava is normal in size with greater than 50% respiratory variability, suggesting right atrial pressure of 3 mmHg. FINDINGS  Left Ventricle: Left ventricular ejection fraction, by estimation, is 55 to  60%. The left ventricle has normal function. Left ventricular endocardial border not optimally defined to evaluate regional wall motion. The left ventricular internal cavity size was normal in size. There is mild left ventricular hypertrophy. Left ventricular diastolic parameters are consistent with Grade I diastolic dysfunction (impaired relaxation). Right Ventricle: The right ventricular size is normal. No increase in right ventricular wall thickness. Right ventricular systolic function is normal. Tricuspid regurgitation signal is inadequate for assessing PA pressure. Left Atrium: Left atrial size  was normal in size. Right Atrium: Right atrial size was normal in size. Pericardium: There is no evidence of pericardial effusion. Mitral Valve: The mitral valve is normal in structure. Mild mitral annular calcification. No evidence of mitral valve regurgitation. No evidence of mitral valve stenosis. Tricuspid Valve: The tricuspid valve is normal in structure. Tricuspid valve regurgitation is not demonstrated. No evidence of tricuspid stenosis. Aortic Valve: The aortic valve is normal in structure. Aortic valve regurgitation is not visualized. No aortic stenosis is present. Aortic valve mean gradient measures 3.0 mmHg. Aortic valve peak gradient measures 6.1 mmHg. Aortic valve area, by VTI measures 1.98 cm. Pulmonic Valve: The pulmonic valve was normal in structure. Pulmonic valve regurgitation is not visualized. No evidence of pulmonic stenosis. Aorta: The aortic root is normal in size and structure. Venous: The inferior vena cava is normal in size with greater than 50% respiratory variability, suggesting right atrial pressure of 3 mmHg. IAS/Shunts: No atrial level shunt detected by color flow Doppler.  LEFT VENTRICLE PLAX 2D LVIDd:         3.60 cm   Diastology LVIDs:         2.60 cm   LV e' medial:    5.33 cm/s LV PW:         1.50 cm   LV E/e' medial:  17.1 LV IVS:        0.90 cm   LV e' lateral:   6.85 cm/s LVOT  diam:     1.80 cm   LV E/e' lateral: 13.3 LV SV:         47 LV SV Index:   24 LVOT Area:     2.54 cm  RIGHT VENTRICLE RV Basal diam:  3.20 cm LEFT ATRIUM             Index LA diam:        3.50 cm 1.77 cm/m LA Vol (A2C):   33.2 ml 16.75 ml/m LA Vol (A4C):   38.9 ml 19.63 ml/m LA Biplane Vol: 35.9 ml 18.12 ml/m  AORTIC VALVE                    PULMONIC VALVE AV Area (Vmax):    2.26 cm     PV Vmax:       1.10 m/s AV Area (Vmean):   2.11 cm     PV Vmean:      83.500 cm/s AV Area (VTI):     1.98 cm     PV VTI:        0.236 m AV Vmax:           123.00 cm/s  PV Peak grad:  4.8 mmHg AV Vmean:          89.300 cm/s  PV Mean grad:  3.0 mmHg AV VTI:            0.239 m AV Peak Grad:      6.1 mmHg AV Mean Grad:      3.0 mmHg LVOT Vmax:         109.00 cm/s LVOT Vmean:        73.900 cm/s LVOT VTI:          0.186 m LVOT/AV VTI ratio: 0.78  AORTA Ao Root diam: 3.30 cm MITRAL VALVE MV Area (PHT): 5.62 cm     SHUNTS MV Decel Time: 135 msec     Systemic VTI:  0.19 m MV E velocity: 91.20 cm/s   Systemic Diam: 1.80  cm MV A velocity: 139.00 cm/s MV E/A ratio:  0.66 Kathlyn Sacramento MD Electronically signed by Kathlyn Sacramento MD Signature Date/Time: 03/02/2022/2:35:53 PM    Final    DG Chest 2 View  Result Date: 03/01/2022 CLINICAL DATA:  Chest pain for the last 6 months on and off. EXAM: CHEST - 2 VIEW COMPARISON:  12/09/2019. FINDINGS: Cardiac silhouette normal in size. No mediastinal or hilar masses. No evidence of adenopathy. Clear lungs.  No pleural effusion or pneumothorax. Right anterolateral chest wall/axillary vascular clips, stable. Skeletal structures are intact. IMPRESSION: No active cardiopulmonary disease. Electronically Signed   By: Lajean Manes M.D.   On: 03/01/2022 14:46    Labs: Basic Metabolic Panel: Recent Labs  Lab 03/01/22 1429  NA 132*  K 3.8  CL 102  CO2 20*  GLUCOSE 163*  BUN 12  CREATININE 0.72  CALCIUM 9.7   CBC: Recent Labs  Lab 03/01/22 1429  WBC 8.0  HGB 13.7  HCT 41.5  MCV  87.2  PLT 204   Microbiology: Results for orders placed or performed during the hospital encounter of 12/09/19  SARS Coronavirus 2 by RT PCR (hospital order, performed in [facility] hospital lab) Nasopharyngeal Nasopharyngeal Swab     Status: None   Collection Time: 12/09/19 10:34 AM   Specimen: Nasopharyngeal Swab  Result Value Ref Range Status   SARS Coronavirus 2 NEGATIVE NEGATIVE Final    Comment: (NOTE) SARS-CoV-2 target nucleic acids are NOT DETECTED.  The SARS-CoV-2 RNA is generally detectable in upper and lower respiratory specimens during the acute phase of infection. The lowest concentration of SARS-CoV-2 viral copies this assay can detect is 250 copies / mL. A negative result does not preclude SARS-CoV-2 infection and should not be used as the sole basis for treatment or other patient management decisions.  A negative result may occur with improper specimen collection / handling, submission of specimen other than nasopharyngeal swab, presence of viral mutation(s) within the areas targeted by this assay, and inadequate number of viral copies (<250 copies / mL). A negative result must be combined with clinical observations, patient history, and epidemiological information.  Fact Sheet for Patients:   StrictlyIdeas.no  Fact Sheet for Healthcare Providers: BankingDealers.co.za  This test is not yet approved or  cleared by the [place] FDA and has been authorized for detection and/or diagnosis of SARS-CoV-2 by FDA under an Emergency Use Authorization (EUA).  This EUA will remain in effect (meaning this test can be used) for the duration of the COVID-19 declaration under Section 564(b)(1) of the Act, 21 U.S.C. section 360bbb-3(b)(1), unless the authorization is terminated or revoked sooner.  Performed at [facility], [address] [place], [place] [address]   Respiratory Panel by RT PCR (Flu A&B, Covid) -  Nasopharyngeal Swab     Status: None   Collection Time: 12/14/19 11:11 AM   Specimen: Nasopharyngeal Swab  Result Value Ref Range Status   SARS Coronavirus 2 by RT PCR NEGATIVE NEGATIVE Final    Comment: (NOTE) SARS-CoV-2 target nucleic acids are NOT DETECTED.  The SARS-CoV-2 RNA is generally detectable in upper respiratoy specimens during the acute phase of infection. The lowest concentration of SARS-CoV-2 viral copies this assay can detect is 131 copies/mL. A negative result does not preclude SARS-Cov-2 infection and should not be used as the sole basis for treatment or other patient management decisions. A negative result may occur with  improper specimen collection/handling, submission of specimen other than nasopharyngeal swab, presence of viral  mutation(s) within the areas targeted by this assay, and inadequate number of viral copies (<131 copies/mL). A negative result must be combined with clinical observations, patient history, and epidemiological information. The expected result is Negative.  Fact Sheet for Patients:  PinkCheek.be  Fact Sheet for Healthcare Providers:  GravelBags.it  This test is no t yet approved or cleared by the [place] FDA and  has been authorized for detection and/or diagnosis of SARS-CoV-2 by FDA under an Emergency Use Authorization (EUA). This EUA will remain  in effect (meaning this test can be used) for the duration of the COVID-19 declaration under Section 564(b)(1) of the Act, 21 U.S.C. section 360bbb-3(b)(1), unless the authorization is terminated or revoked sooner.     Influenza A by PCR NEGATIVE NEGATIVE Final   Influenza B by PCR NEGATIVE NEGATIVE Final    Comment: (NOTE) The Xpert Xpress SARS-CoV-2/FLU/RSV assay is intended as an aid in  the diagnosis of influenza from Nasopharyngeal swab specimens and  should not be used as a sole basis for treatment. Nasal washings and   aspirates are unacceptable for Xpert Xpress SARS-CoV-2/FLU/RSV  testing.  Fact Sheet for Patients: PinkCheek.be  Fact Sheet for Healthcare Providers: GravelBags.it  This test is not yet approved or cleared by the [place] FDA and  has been authorized for detection and/or diagnosis of SARS-CoV-2 by  FDA under an Emergency Use Authorization (EUA). This EUA will remain  in effect (meaning this test can be used) for the duration of the  Covid-19 declaration under Section 564(b)(1) of the Act, 21  U.S.C. section 360bbb-3(b)(1), unless the authorization is  terminated or revoked. Performed at [facility], [address], [place], [place] [address]    Time coordinating discharge: Over 30 minutes  Richarda Osmond, MD  [facility] 03/02/2022, 3:35 PM

## 2022-03-02 NOTE — Progress Notes (Signed)
PROGRESS NOTE  Melanie Hammond    DOB: 01-Dec-1950, 71 y.o.  EXN:[id number]    Code Status: Full Code   DOA: 03/01/2022   LOS: 0   Brief hospital course  Melanie Hammond is a 71 y.o. female with a PMH significant for diabetes mellitus, hypertension, atrial flutter on anticoagulation therapy, Alzheimer's dementia, nicotine dependence.  They presented from home to the ED on 03/01/2022 with chest main intermittently x several months. Relieved with rest and worse with exertion. Has associated nausea,SOB. Received nitro and ASA via EMS which resolved pain.  In the ED, it was found that they had stable vital signs with elevated BP to 151/80.  Significant findings included ECG with LVH and 1st degree AV block.  Sodium 132, potassium 3.8, chloride 112, bicarb 20, glucose 163, BUN 12, creatinine 0.72, calcium 9.7, troponin 4, white count 8.0, hemoglobin 13.7, hematocrit 41.5, platelet count 204 Chest x-ray shows no evidence of acute cardiopulmonary disease  They were initially treated with home metoprolol.   Patient was admitted to medicine service for further workup and management of chest pain as outlined in detail below.  03/02/22 -stable  Assessment & Plan  Principal Problem:   Unstable angina (H[facility]) Active Problems:   Diabetes mellitus (H[facility])   Nicotine dependence   Essential hypertension   Atrial flutter (H[facility])   Dementia without behavioral disturbance (H[facility])   Depression   Hypothyroidism  Chest pain- resolved - cardiology following, appreciate your care  - myoview scan today - nitro PRN - f/u echo   Atrial flutter s/p ablation Continue metoprolol for rate control Continue Xarelto as primary prophylaxis for an acute stroke   Essential hypertension Continue metoprolol, verapamil, hydrochlorothiazide, spironolactone and Avapro   Hypothyroidism Continue Synthroid   Depression Continue bupropion, buspirone and sertraline   Dementia without behavioral disturbance (HCC) Continue  donepezil   Nicotine dependence Smoking cessation discussed with patient in detail She declines a nicotine transdermal patch   Diabetes mellitus (HCC) Hold metformin Maintain consistent carbohydrate diet Glycemic control with sliding scale insulin  Body mass index is 28.28 kg/m.  VTE ppx:  rivaroxaban (XARELTO) tablet 20 mg   Diet:     Diet   Diet NPO time specified   Consultants: Cardiology   Subjective 03/02/22    Pt reports no complaints at this time. Questions and concerns addressed at time of encounter.   Objective   Vitals:   03/01/22 1423 03/01/22 1426 03/02/22 0402  BP: (!) 151/80  133/66  Pulse: 84  63  Resp: 17  18  Temp: 98.2 F (36.8 C)  97.8 F (36.6 C)  TempSrc: Oral  Oral  SpO2: 99%  96%  Weight:  84.4 kg    No intake or output data in the 24 hours ending 03/02/22 0812 Filed Weights   03/01/22 1426  Weight: 84.4 kg     Physical Exam:  General: awake, alert, NAD HEENT: atraumatic, clear conjunctiva, anicteric sclera, MMM, hearing grossly normal Respiratory: normal respiratory effort. Cardiovascular: quick capillary refill, normal S1/S2, RRR, no JVD, murmurs Nervous: A&O x3. no gross focal neurologic deficits, normal speech Extremities: moves all equally, no edema, normal tone Skin: dry, intact, normal temperature, normal color. No rashes, lesions or ulcers on exposed skin Psychiatry: normal mood, congruent affect  Labs   I have personally reviewed the following labs and imaging studies CBC    Component Value Date/Time   WBC 8.0 03/01/2022 1429   RBC 4.76 03/01/2022 1429   HGB 13.7 03/01/2022 1429  HCT 41.5 03/01/2022 1429   PLT 204 03/01/2022 1429   MCV 87.2 03/01/2022 1429   MCH 28.8 03/01/2022 1429   MCHC 33.0 03/01/2022 1429   RDW 15.6 (H) 03/01/2022 1429   LYMPHSABS 0.7 12/09/2019 1115   MONOABS 0.3 12/09/2019 1115   EOSABS 0.1 12/09/2019 1115   BASOSABS 0.0 12/09/2019 1115      Latest Ref Rng & Units 03/01/2022     2:29 PM 12/14/2019    5:04 AM 12/13/2019    4:50 AM  BMP  Glucose 70 - 99 mg/dL 700  174  944   BUN 8 - 23 mg/dL 12  16  14    Creatinine 0.44 - 1.00 mg/dL  9.67  5.91   Sodium 135 - 145 mmol/L 132  134  133   Potassium 3.5 - 5.1 mmol/L 3.8  4.0  4.0   Chloride 98 - 111 mmol/L 102  101  101   CO2 22 - 32 mmol/L 20  27  24    Calcium 8.9 - 10.3 mg/dL 9.7  9.0  9.2     DG Chest 2 View  Result Date: 03/01/2022 CLINICAL DATA:  Chest pain for the last 6 months on and off. EXAM: CHEST - 2 VIEW COMPARISON:  12/09/2019. FINDINGS: Cardiac silhouette normal in size. No mediastinal or hilar masses. No evidence of adenopathy. Clear lungs.  No pleural effusion or pneumothorax. Right anterolateral chest wall/axillary vascular clips, stable. Skeletal structures are intact. IMPRESSION: No active cardiopulmonary disease. Electronically Signed   By: 14/12/2021 M.D.   On: 03/01/2022 14:46    Disposition Plan & Communication  Patient status: Observation  Admitted From: Home Planned disposition location: Home Anticipated discharge date: 12/12 pending cardiac clearance  Family Communication: none    Author: 14/12/2021, DO [facility] 03/02/2022, 8:12 AM   Available by Epic secure chat 7AM-7PM. If 7PM-7AM, please contact night-coverage.  [facility] contact information found on [facility].

## 2022-03-02 NOTE — Progress Notes (Signed)
*  PRELIMINARY RESULTS* Echocardiogram 2D Echocardiogram has been performed.  Melanie Hammond 03/02/2022, 2:15 PM

## 2022-03-02 NOTE — Care Management CC44 (Signed)
Condition Code 44 Documentation Completed  Patient Details  Name: Melanie Hammond MRN: [id number] Date of Birth: Dec 27, 1950   Condition Code 44 given:  Yes Patient signature on Condition Code 44 notice:  Yes Documentation of 2 MD's agreement:  Yes Code 44 added to claim:  Yes    Allayne Butcher, RN 03/02/2022, 4:28 PM

## 2022-03-02 NOTE — ED Notes (Signed)
Daughter called stating she is POA.  Request to be called by doctor for all care r/t pt having dementia and not understanding or remembering what she has been told.

## 2022-03-02 NOTE — Discharge Instructions (Signed)
Please follow up with cardiology as directed Return to the emergency department for repeat chest pain

## 2022-03-27 ENCOUNTER — Ambulatory Visit: Payer: Medicare HMO | Attending: Medical | Admitting: Medical

## 2022-03-27 NOTE — Progress Notes (Deleted)
Cardiology Office Note:    Date:  03/27/2022   ID:  Melanie Hammond, DOB 10-29-1950, MRN [id number]  PCP:  Karie Kirks, MD  [facility] Cardiologist:  None  [facility] Electrophysiologist:  None   Referring MD: Karie Kirks, MD   Chief Complaint: chest pain/hospital follow-up  History of Present Illness:    Melanie Hammond is a 72 y.o. 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   Melanie Hammond was previously seen by outside hospital. She has a history of atrial flutter. EP study in December 20187 showed inducible aflutter with successful ablation. There was no inducible VT, no accessory pathway, dual AV nodal physiology but no inducible AV node reentrant tachycardia. She is on Xarelto for stroke ppx. She also has a h/o PFO. She has history of difficult to control HTN on multiple medications.    Echo 07/2020 showed LVEF 60%, G1DD, mild MR, mild TR, RVSP 37mmHg   The patient smokes 1 ppd. NO alochol or drug history. She lives with her daughter, functional at baseline.  The patient was recently admitted for chest pain. HS trop was negative x 4. EKG was nonacute. Echo showed LVEF 55-60%, mild LVH, G1DD. Myoview Lexiscan was normal and low risk with no evidence of ischemia. She was discharged.   Today,   Past Medical History:  Diagnosis Date   Diabetes mellitus without complication ([place])    Hypertension     Past Surgical History:  Procedure Laterality Date   INTRAMEDULLARY (IM) NAIL INTERTROCHANTERIC Left 12/09/2019   Procedure: INTRAMEDULLARY (IM) NAIL INTERTROCHANTRIC;  Surgeon: Corky Mull, MD;  Location: [facility] ORS;  Service: Orthopedics;  Laterality: Left;   ORIF ANKLE FRACTURE Right 12/11/2019   Procedure: OPEN REDUCTION INTERNAL FIXATION (ORIF) ANKLE  FRACTURE;  Surgeon: Corky Mull, MD;  Location: [facility] ORS;  Service: Orthopedics;  Laterality: Right;    Current Medications: No outpatient medications have been marked as taking for the 03/27/22 encounter (Appointment) with Kathlen Mody, Jerimiah Wolman H, PA-C.     Allergies:   Compazine [prochlorperazine] and Thorazine [chlorpromazine]   Social History   Socioeconomic History   Marital status: Single    Spouse name: Not on file   Number of children: Not on file   Years of education: Not on file   Highest education level: Not on file  Occupational History   Not on file  Tobacco Use   Smoking status: Every Day    Packs/day: 0.50    Years: 10.00    Total pack years: 5.00    Types: Cigarettes   Smokeless tobacco: Never  Substance and Sexual Activity   Alcohol use: Not on file   Drug use: Never   Sexual activity: Not on file  Other Topics Concern   Not on file  Social History Narrative   Not on file   Social Determinants of Health   Financial Resource Strain: Not on file  Food Insecurity: Not on file  Transportation Needs: Not on file  Physical Activity: Not on file  Stress: Not on file  Social Connections: Not on file     Family History: The patient's ***family history includes Hypertension in her father and mother.  ROS:   Please see the history of present illness.    *** All other systems reviewed and  are negative.  EKGs/Labs/Other Studies Reviewed:    The following studies were reviewed today: ***  EKG:  EKG is *** ordered today.  The ekg ordered today demonstrates ***  Recent Labs: 03/01/2022: BUN 12; Creatinine, Ser 0.72; Hemoglobin 13.7; Platelets 204; Potassium 3.8; Sodium 132  Recent Lipid Panel No results found for: "CHOL", "TRIG", "HDL", "CHOLHDL", "VLDL", "LDLCALC", "LDLDIRECT"   Risk Assessment/Calculations:   {Does this patient have ATRIAL FIBRILLATION?[phone]}   Physical Exam:    VS:  There were no vitals taken for this visit.    Wt Readings  from Last 3 Encounters:  03/01/22 186 lb (84.4 kg)  12/09/19 192 lb (87.1 kg)     GEN: *** Well nourished, well developed in no acute distress HEENT: Normal NECK: No JVD; No carotid bruits LYMPHATICS: No lymphadenopathy CARDIAC: ***RRR, no murmurs, rubs, gallops RESPIRATORY:  Clear to auscultation without rales, wheezing or rhonchi  ABDOMEN: Soft, non-tender, non-distended MUSCULOSKELETAL:  No edema; No deformity  SKIN: Warm and dry NEUROLOGIC:  Alert and oriented x 3 PSYCHIATRIC:  Normal affect   ASSESSMENT:    No diagnosis found. PLAN:    In order of problems listed above:  ***  Disposition: Follow up {follow up:15908} with ***   Shared Decision Making/Informed Consent   {Are you ordering a CV Procedure (e.g. stress test, cath, DCCV, TEE, etc)?   Press F2        :[id number]}    Signed, Cerina Leary Arlyss Repress  03/27/2022 7:48 AM    [facility]

## 2022-04-03 DIAGNOSIS — R32 Unspecified urinary incontinence: Secondary | ICD-10-CM | POA: Diagnosis not present

## 2022-04-03 DIAGNOSIS — K219 Gastro-esophageal reflux disease without esophagitis: Secondary | ICD-10-CM | POA: Diagnosis not present

## 2022-04-03 DIAGNOSIS — Z7951 Long term (current) use of inhaled steroids: Secondary | ICD-10-CM | POA: Diagnosis not present

## 2022-04-03 DIAGNOSIS — E1151 Type 2 diabetes mellitus with diabetic peripheral angiopathy without gangrene: Secondary | ICD-10-CM | POA: Diagnosis not present

## 2022-04-03 DIAGNOSIS — Z79891 Long term (current) use of opiate analgesic: Secondary | ICD-10-CM | POA: Diagnosis not present

## 2022-04-03 DIAGNOSIS — K5900 Constipation, unspecified: Secondary | ICD-10-CM | POA: Diagnosis not present

## 2022-04-03 DIAGNOSIS — M810 Age-related osteoporosis without current pathological fracture: Secondary | ICD-10-CM | POA: Diagnosis not present

## 2022-04-03 DIAGNOSIS — E785 Hyperlipidemia, unspecified: Secondary | ICD-10-CM | POA: Diagnosis not present

## 2022-04-03 DIAGNOSIS — Z825 Family history of asthma and other chronic lower respiratory diseases: Secondary | ICD-10-CM | POA: Diagnosis not present

## 2022-04-03 DIAGNOSIS — Z7983 Long term (current) use of bisphosphonates: Secondary | ICD-10-CM | POA: Diagnosis not present

## 2022-04-03 DIAGNOSIS — G4700 Insomnia, unspecified: Secondary | ICD-10-CM | POA: Diagnosis not present

## 2022-04-03 DIAGNOSIS — Z7901 Long term (current) use of anticoagulants: Secondary | ICD-10-CM | POA: Diagnosis not present

## 2022-04-03 DIAGNOSIS — Z96649 Presence of unspecified artificial hip joint: Secondary | ICD-10-CM | POA: Diagnosis not present

## 2022-04-03 DIAGNOSIS — Z85828 Personal history of other malignant neoplasm of skin: Secondary | ICD-10-CM | POA: Diagnosis not present

## 2022-04-03 DIAGNOSIS — Z823 Family history of stroke: Secondary | ICD-10-CM | POA: Diagnosis not present

## 2022-04-03 DIAGNOSIS — M1990 Unspecified osteoarthritis, unspecified site: Secondary | ICD-10-CM | POA: Diagnosis not present

## 2022-04-03 DIAGNOSIS — I4892 Unspecified atrial flutter: Secondary | ICD-10-CM | POA: Diagnosis not present

## 2022-04-03 DIAGNOSIS — E039 Hypothyroidism, unspecified: Secondary | ICD-10-CM | POA: Diagnosis not present

## 2022-07-22 IMAGING — XA DG C-ARM 1-60 MIN
5 series · 5 of 5 positions shown · non-contrast
Comparison: 12/09/2019

CLINICAL DATA: Ankle surgery

EXAM:
DG C-ARM 1-60 MIN
FLUOROSCOPY TIME:  Fluoroscopy Time:  35 seconds
Number of Acquired Spot Images: 5

[Series 1: ortho standard · 1 of 1 slices shown (1 of 5)]
[im 1/1]
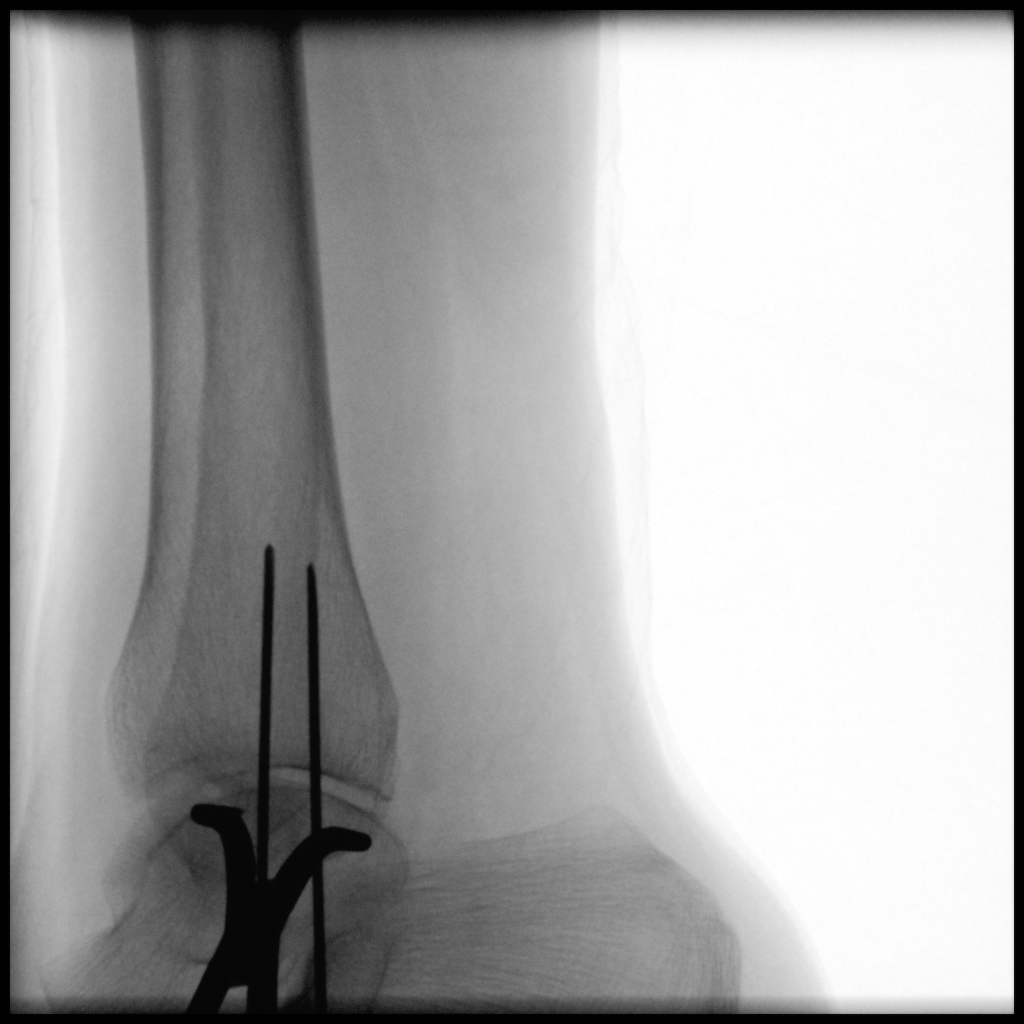

[Series 2: ortho standard · 1 of 1 slices shown (2 of 5)]
[im 1/1]
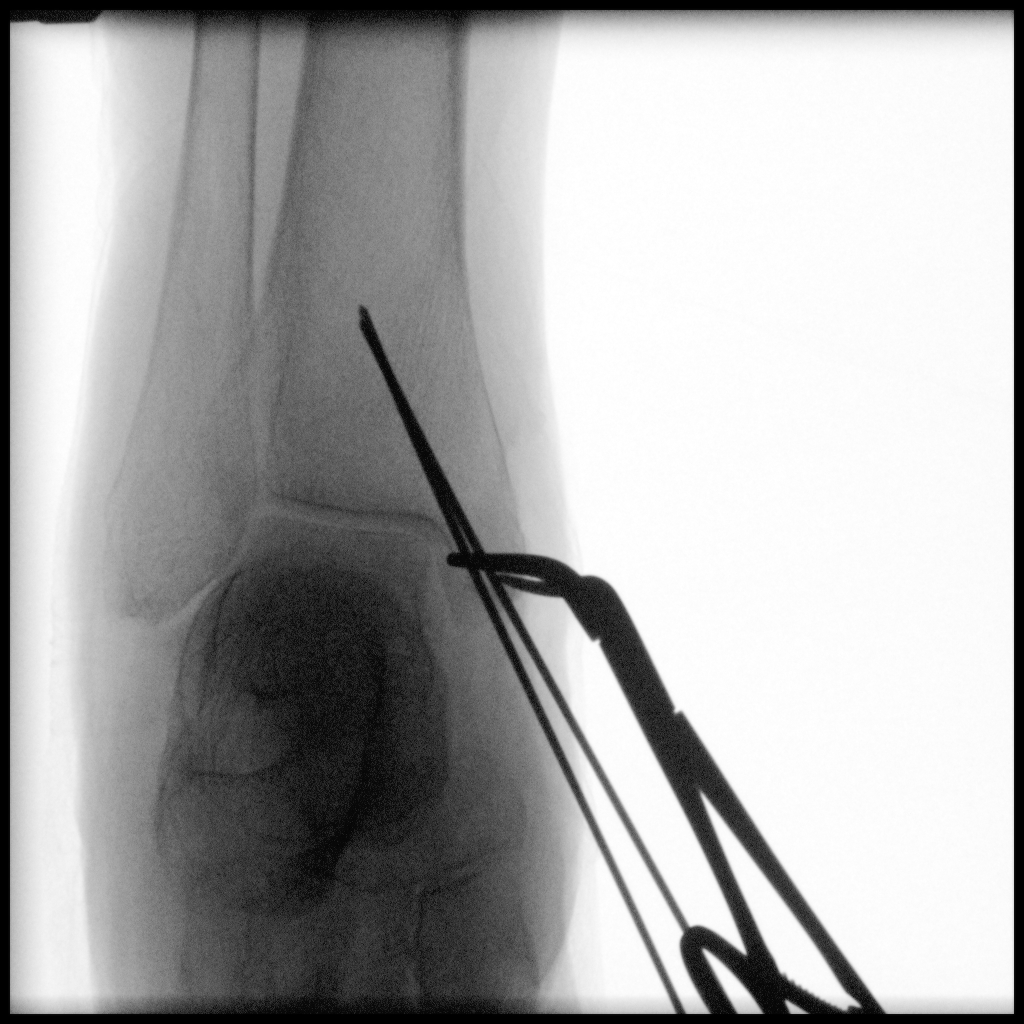

[Series 3: ortho standard · 1 of 1 slices shown (3 of 5)]
[im 1/1]
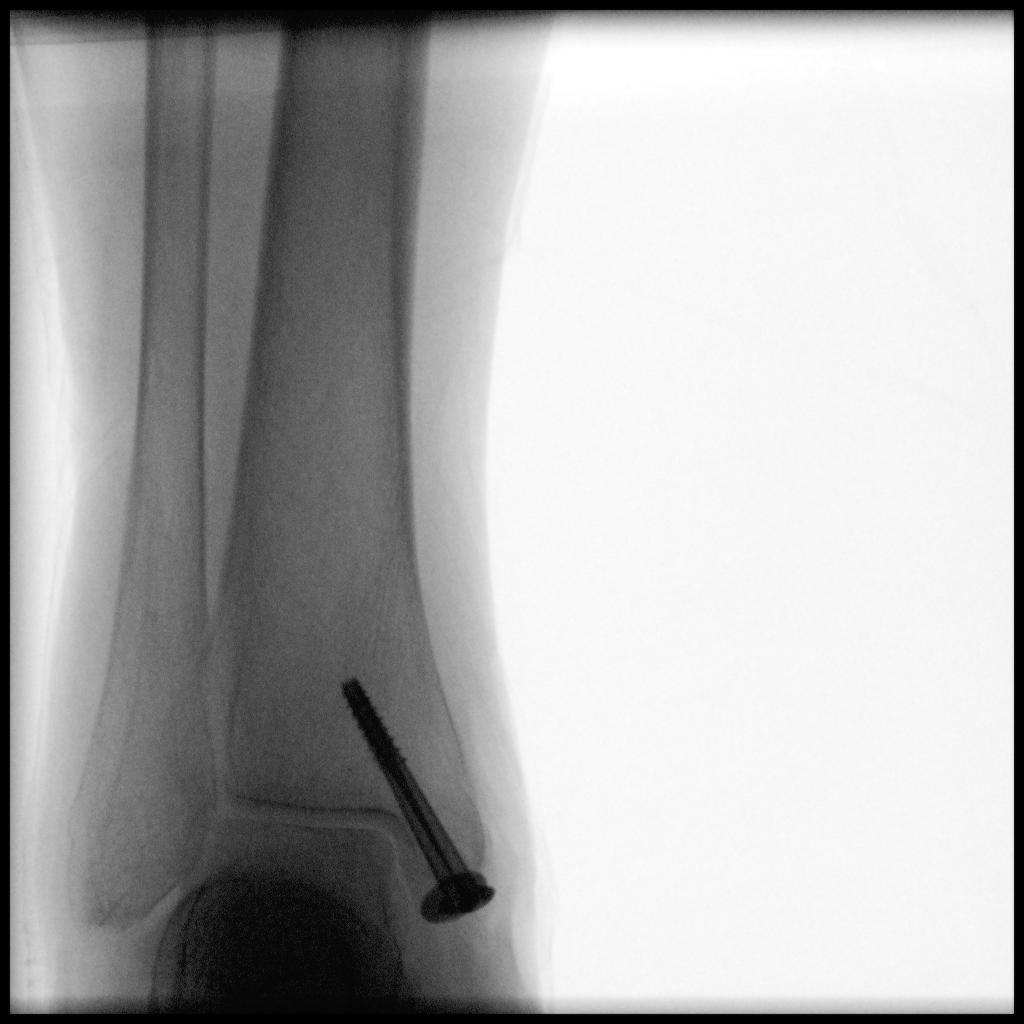

[Series 4: ortho standard · 1 of 1 slices shown (4 of 5)]
[im 1/1]
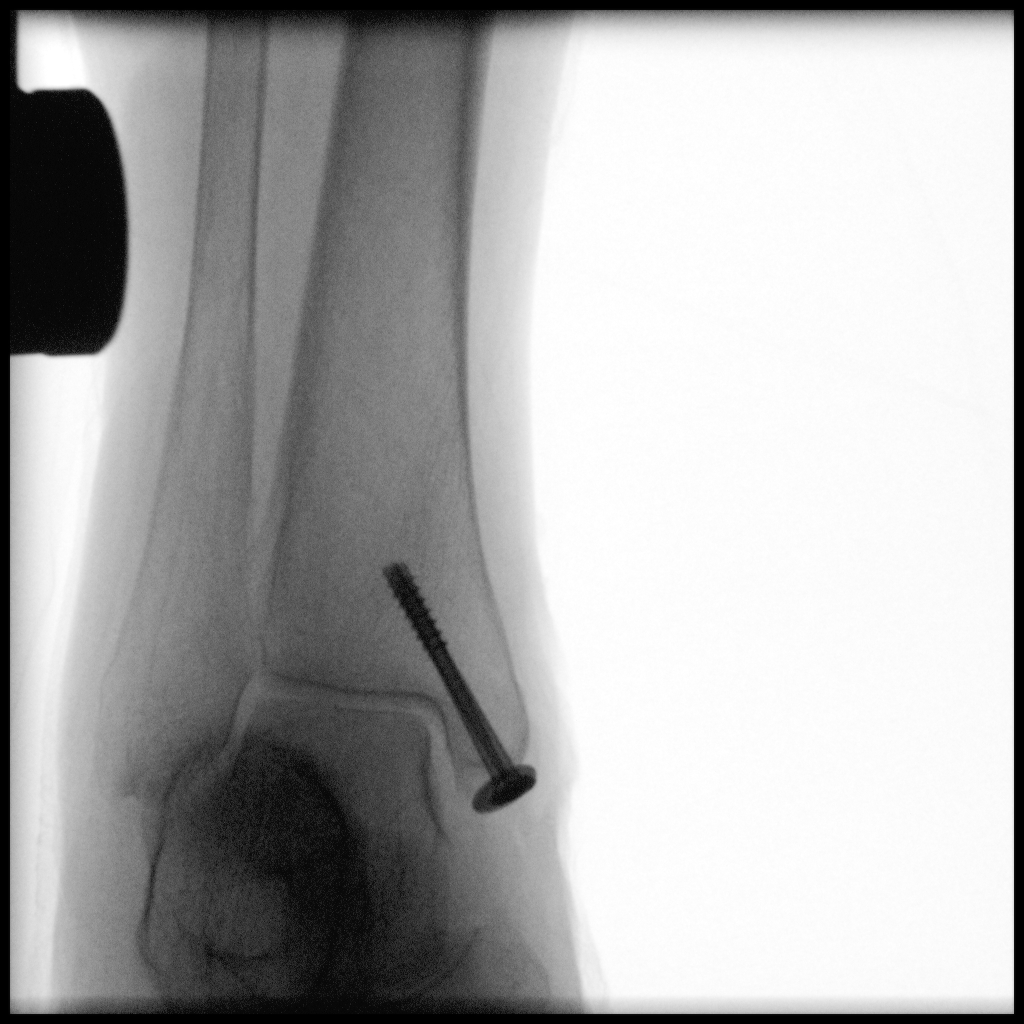

[Series 5: ortho standard · 1 of 1 slices shown (5 of 5)]
[im 1/1]
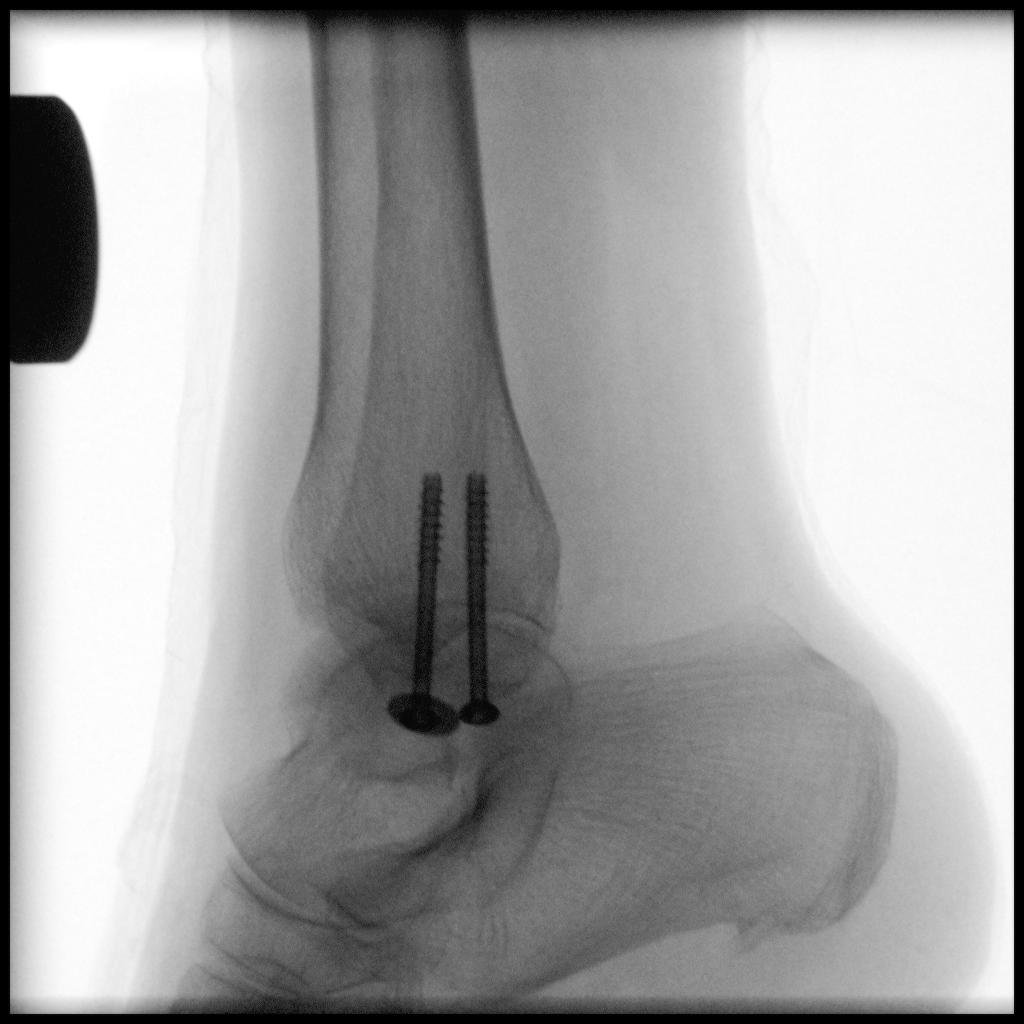

[5 of 5 positions shown; findings below may reference images not displayed]

FINDINGS: The patient has undergone ORIF of the medial malleolus. There are 2
cannulated screws, both of which appear well positioned and are
intact. There are expected postsurgical changes.
IMPRESSION: Status post ORIF of the medial malleolus.

## 2022-08-12 DIAGNOSIS — R300 Dysuria: Secondary | ICD-10-CM | POA: Diagnosis not present

## 2022-08-12 DIAGNOSIS — M810 Age-related osteoporosis without current pathological fracture: Secondary | ICD-10-CM | POA: Diagnosis not present

## 2022-08-12 DIAGNOSIS — E039 Hypothyroidism, unspecified: Secondary | ICD-10-CM | POA: Diagnosis not present

## 2022-08-12 DIAGNOSIS — E119 Type 2 diabetes mellitus without complications: Secondary | ICD-10-CM | POA: Diagnosis not present

## 2022-08-12 DIAGNOSIS — I4892 Unspecified atrial flutter: Secondary | ICD-10-CM | POA: Diagnosis not present

## 2022-08-12 DIAGNOSIS — F1190 Opioid use, unspecified, uncomplicated: Secondary | ICD-10-CM | POA: Diagnosis not present

## 2022-08-12 DIAGNOSIS — N390 Urinary tract infection, site not specified: Secondary | ICD-10-CM | POA: Diagnosis not present

## 2022-08-12 DIAGNOSIS — F0280 Dementia in other diseases classified elsewhere without behavioral disturbance: Secondary | ICD-10-CM | POA: Diagnosis not present

## 2022-08-12 DIAGNOSIS — G309 Alzheimer's disease, unspecified: Secondary | ICD-10-CM | POA: Diagnosis not present

## 2022-08-12 DIAGNOSIS — E7800 Pure hypercholesterolemia, unspecified: Secondary | ICD-10-CM | POA: Diagnosis not present

## 2022-08-12 DIAGNOSIS — I10 Essential (primary) hypertension: Secondary | ICD-10-CM | POA: Diagnosis not present

## 2023-06-11 LAB — COLOGUARD

## 2023-07-02 LAB — COLOGUARD

## 2023-07-29 LAB — COLOGUARD

## 2023-10-15 NOTE — Progress Notes (Signed)
 Chief Complaint  Patient presents with  . Nail Problem    Subjective   History of Present Illness Melanie Hammond is a 73 year old female with diabetes who presents with a right toe wound  She has a toenail that is coming off and is blue in color. It is almost detached and not causing any pain. She recalls an incident where she was cutting her nails and may have injured the area, which has since scabbed over. She is concerned about potential bleeding due to her use of the blood thinner Xarelto , as she recalls a previous incident where a toenail came off and caused significant bleeding and pain when it was in her shoe.  She has diabetes and is due for blood work to check her A1c levels. Her blood sugar monitor is not functioning due to a dead battery, and she needs to get her sugar checked.  She has not had a mammogram in five to six years, with the last one being done in [place] . She reports back pain when standing at the refrigerator or getting ice.  Patient's blood pressure was was elevated systolically for the office visit.  She has issues with chronic low back pain which cause her blood pressure to be increased, as patient was seen in a wheelchair due to having increased low back pain.  Review of Systems  Constitutional:  Negative for chills and fever.  HENT:  Negative for ear pain and sore throat.   Eyes:  Negative for pain and visual disturbance.  Respiratory:  Negative for cough and shortness of breath.   Cardiovascular:  Negative for chest pain and palpitations.  Gastrointestinal:  Negative for abdominal pain and vomiting.  Genitourinary:  Negative for dysuria and hematuria.  Musculoskeletal:  Positive for gait problem (Seen in a wheelchair). Negative for arthralgias and back pain.  Skin:  Positive for wound (Has healed wound to middle toe of left foot). Negative for color change and rash.  Neurological:  Negative for seizures and syncope.  All other systems reviewed and are  negative.   Patient Active Problem List  Diagnosis  . Atrial flutter (CMS/HHS-HCC)  . MVP (mitral valve prolapse)  . Hypothyroidism  . HTN, goal below 140/80  . Chronic back pain  . Trochanteric bursitis, left hip  . Branch retinal vein occlusion (CMS-HCC)  . GAD (generalized anxiety disorder)  . Gastroesophageal reflux disease  . Polypharmacy  . Tremulousness  . Type 2 diabetes mellitus without complication, without long-term current use of insulin  (CMS/HHS-HCC)  . Chronic pain syndrome  . Elevated parathyroid hormone  . Primary insomnia  . Chronic, continuous use of opioids  . Mixed hyperlipidemia due to type 2 diabetes mellitus  (CMS/HHS-HCC)  . Major depressive disorder, recurrent, moderate (CMS/HHS-HCC)  . Open wound of third toe of right foot  . Compression fracture of L2 vertebra with routine healing    Outpatient Medications Prior to Visit  Medication Sig Dispense Refill  . albuterol  MDI, PROVENTIL , VENTOLIN , PROAIR , HFA 90 mcg/actuation inhaler INHALE 2 PUFFS EVERY 4 HOURS AS NEEDED FOR SHORTNESS OF BREATH OR WHEEZING 1 each 8  . alcohol swabs (BD ALCOHOL SWABS) PadM Apply 100 each topically 3 (three) times daily 100 each 12  . amitriptyline  (ELAVIL ) 100 MG tablet TAKE 1 TABLET EVERY DAY 100 tablet 3  . amLODIPine  (NORVASC ) 10 MG tablet TAKE 1 TABLET EVERY DAY 90 tablet 3  . aspirin  81 MG EC tablet Take 81 mg by mouth once daily    .  blood glucose control, normal Soln Use 1 each as needed True Metrix Level 1 control solution 1 each 0  . buPROPion  (WELLBUTRIN  XL) 150 MG XL tablet TAKE 1 TABLET EVERY DAY 90 tablet 3  . busPIRone  (BUSPAR ) 15 MG tablet TAKE 1 TABLET TWICE DAILY 200 tablet 3  . carisoprodoL  (SOMA ) 350 MG tablet Take 1 tablet (350 mg total) by mouth at bedtime 30 tablet 3  . carvediloL (COREG) 25 MG tablet TAKE 1 TABLET TWICE DAILY WITH MEALS 180 tablet 3  . donepeziL  (ARICEPT ) 5 MG tablet TAKE 1 TABLET EVERY DAY 100 tablet 1  . dorzolamide -timoloL  (COSOPT )  22.3-6.8 mg/mL ophthalmic solution Place 1 drop into the left eye 2 (two) times daily 10 mL 3  . famotidine (PEPCID) 20 MG tablet TAKE 1 TABLET TWICE DAILY 180 tablet 3  . fluticasone  furoate-vilanteroL (BREO ELLIPTA ) 100-25 mcg/dose DsDv inhaler INHALE 1 PUFF INTO THE LUNGS ONE TIME DAILY 3 each 3  . hydrALAZINE  (APRESOLINE ) 100 MG tablet TAKE 1 TABLET THREE TIMES DAILY 270 tablet 3  . irbesartan  (AVAPRO ) 300 MG tablet TAKE 1 TABLET EVERY DAY 90 tablet 3  . latanoprost  (XALATAN ) 0.005 % ophthalmic solution Place 1 drop into both eyes at bedtime 2.5 mL 0  . levothyroxine  (SYNTHROID ) 50 MCG tablet TAKE 1 TABLET EVERY DAY 30 TO 60 MINUTES BEFORE BREAKFAST, ON AN EMPTY STOMACH, AND WITH A GLASS OF WATER 90 tablet 3  . metFORMIN (GLUCOPHAGE) 500 MG tablet TAKE 1 TABLET TWICE DAILY WITH MEALS 180 tablet 3  . oxyCODONE -acetaminophen  (PERCOCET) 5-325 mg tablet Take 1 tablet by mouth 3 (three) times daily for 30 days Take one tablet in AM and two tablets at bedtime 90 tablet 0  . rivaroxaban  (XARELTO ) 20 mg tablet TAKE 1 TABLET EVERY DAY WITH BREAKFAST 100 tablet 3  . rosuvastatin (CRESTOR) 5 MG tablet take 1 tablet every day 90 tablet 3  . sertraline  (ZOLOFT ) 50 MG tablet TAKE 1 TABLET EVERY DAY 100 tablet 3  . spironolactone  (ALDACTONE ) 50 MG tablet TAKE 1 TABLET(50 MG) BY MOUTH EVERY DAY 90 tablet 0  . traZODone  (DESYREL ) 150 MG tablet TAKE 1 TABLET AT BEDTIME 90 tablet 3  . TRUEPLUS LANCETS TEST BLOOD SUGAR THREE TIMES DAILY 300 each 3  . alendronate (FOSAMAX) 70 MG tablet Take 1 tablet (70 mg total) by mouth every 7 (seven) days Take with a full glass of water. Do not lie down for the next 30 min. 4 tablet 11  . blood glucose diagnostic (TRUE METRIX GLUCOSE TEST STRIP) test strip 1 each (1 strip total) 3 (three) times daily Use as instructed. 100 each 12  . cholecalciferol  (VITAMIN D3) 1000 unit capsule Take by mouth 2 (two) times daily    . naloxone (NARCAN) 4 mg/actuation nasal spray Place 1 spray  (4 mg total) into one nostril once as needed (if not breathing or showing signs of opioid overdose) for up to 1 dose 2 each 1   No facility-administered medications prior to visit.      Objective  There were no vitals filed for this visit. There is no height or weight on file to calculate BMI.  Home Vitals:     Physical Exam Constitutional:      Appearance: Normal appearance.  HENT:     Head: Normocephalic and atraumatic.     Nose: Nose normal.  Eyes:     Conjunctiva/sclera: Conjunctivae normal.  Cardiovascular:     Rate and Rhythm: Normal rate and regular rhythm.  Heart sounds: Normal heart sounds.  Pulmonary:     Effort: Pulmonary effort is normal.     Breath sounds: Normal breath sounds.  Abdominal:     General: Abdomen is flat. Bowel sounds are normal.     Palpations: Abdomen is soft.  Musculoskeletal:        General: Tenderness (Of lumbar spine area) present. Normal range of motion.  Skin:    General: Skin is warm and dry.     Findings: Lesion (Has wound to 3rd right toe, no pain or warmth with palpation, is open wound) present.  Neurological:     Mental Status: She is alert and oriented to person, place, and time. Mental status is at baseline.     Gait: Gait abnormal (Ambulates with a wheelchair).  Psychiatric:        Mood and Affect: Mood normal.        Behavior: Behavior normal.    Physical Exam   Results BP Readings from Last 3 Encounters:  05/11/23 (!) 151/74  08/12/22 (!) 161/73  02/05/22 (!) 174/78    Wt Readings from Last 10 Encounters:  05/11/23 79.4 kg (175 lb 0.7 oz)  08/12/22 83.6 kg (184 lb 4.9 oz)  02/05/22 84.8 kg (186 lb 15.2 oz)  07/16/21 86.1 kg (189 lb 12.8 oz)  01/20/21 85.9 kg (189 lb 4.8 oz)  01/16/21 84.8 kg (187 lb)  12/30/20 85.2 kg (187 lb 13.3 oz)  10/28/20 95.3 kg (210 lb)  10/21/20 86.9 kg (191 lb 8 oz)  10/21/20 84.8 kg (187 lb)   - Lab Results  Component Value Date   WBC 5.4 05/11/2023   HGB 12.9 05/11/2023    HCT 39.3 05/11/2023   PLT 154 05/11/2023   - Lab Results  Component Value Date   NA 135 05/11/2023   K 4.3 05/11/2023   CL 103 05/11/2023   CO2 23 05/11/2023   BUN 11 05/11/2023   CREATININE 1.0 05/11/2023   CALCIUM 10.0 05/11/2023   ALB 3.8 05/11/2023   TBILI 0.4 05/11/2023   ALKPHOS 39 05/11/2023   AST 16 05/11/2023   ALT 15 05/11/2023   GLUCOSE 107 05/11/2023   GFR 60 05/11/2023   - Lab Results  Component Value Date   CHOLTOTAL 87 08/12/2022   TRIG 185 08/12/2022   HDL 30 08/12/2022   LDLCALC 20 08/12/2022   - Lab Results  Component Value Date   HGBA1C 6.0 (H) 05/11/2023   - Lab Results  Component Value Date   TSH 3.19 05/11/2023   T4FREE 0.99 05/11/2023   - Lab Results  Component Value Date   COLORU Yellow 08/12/2022   CLARITYU Clear 08/12/2022   SPECGRAV 1.025 08/12/2022   GLUCOSEU Negative 08/12/2022   KETONESU Negative 08/12/2022   BLOODU 2+ (!) 08/12/2022   NITRITE Positive (!) 08/12/2022   LEUKOCYTESUR Trace (!) 08/12/2022   BILIRUBINUR Negative 08/12/2022   UROBILINOGEN 0.2 08/12/2022   - Lab Results  Component Value Date   VITD 21 (L) 11/25/2021          Assessment/Plan:   Assessment & Plan Type 2 Diabetes Mellitus: Stable  Reviewed labs from 05/11/2023 which showed hemoglobin A1c of 6.0.  Type 2 Diabetes Mellitus with recent lack of blood glucose monitoring due to a non-functional glucose meter. Regular monitoring is essential for effective management. - Will continue with current treatment including 500 mg of metformin twice daily. - Will obtain routine labs after office visit. - Will refer patient to podiatry for  diabetic footcare.  Open Wound 3rd Toe: Worsening Toenail detachment likely from a previous injury while cutting nails. No active infection, area scabbed over. On anticoagulation therapy, increasing bleeding risk and complicating healing. Diabetes and anticoagulation increase infection and prolonged bleeding risk if  further injury occurs.  Patient has an open wound on the front of her third toe which occurred while she was cutting her nails.  This wound occurred 3 months ago, patient denies any issues pain of that toe.  On exam there is no sign of infection but open wound still persists. - Will refer patient to wound care clinic for further evaluation.  A-Flutter: Stable Reviewed labs from 05/11/2023.  Patient denies any issues shortness of breath or palpitations.  Patient denies any issues with abnormal bleeding or bruising. - Will continue current treatment including 25 mg of carvedilol twice daily along with 20 mg of Xarelto  daily. - Will obtain routine labs after office visit  HTN: Worsening Reviewed labs from 05/01/2023.  Patient has slightly elevated systolic blood pressure reading during the office visit.  Denies any headaches, dizziness, or blurred vision.  Patient is not being followed by cardiology. - Will continue with current treatment and blood pressure checks, including 10 mg of amlodipine  daily, 25 mg of carvedilol twice daily, 100 mg of hydralazine  3 times a day, 300 mg of Avapro  daily, along with 50 mg of Aldactone  daily. - Will obtain routine labs after office visit.  Dyslipidemia: Stable Reviewed labs from 08/12/2022 which showed lipid panel within normal range.  Patient is tolerating her cholesterol medication well, denies any lower extremity cramps or muscle aches. - Will continue with current treatment including milligrams of rosuvastatin daily along with 81 mg of aspirin  daily. - Will obtain routine labs after office visit.  Hypothyroidism: Stable Reviewed labs from 05/11/2023 which showed thyroid  panel within normal range.  Patient has issues of chronic fatigue due to chronic pain issues. - Will continue with current treatment including 50 mcg levothyroxine  daily. - Will obtain routine labs after office visit.  L2 Compression Fracture: Worsening Reviewed x-rays from 10/13/2019 which  showed L2 compression fracture.  Patient was seen in wheelchair today, continues have issues with localized lumbar spine pain.  She has worse pain with prolonged standing and performing ADLs.  Patient is been on opioid-based pain medication regiment for quite some time. - Will continue with current treatment including 5-325 mg of Percocet 3 times a day along with 350 mg of Soma  3 times a day as needed. - Will continue 100 mg of amitriptyline  at bedtime. - Patient does have Narcan nasal spray at home to be used if needed.  General Health Maintenance Overdue for a mammogram, last performed five or six years ago in [place] . Regular screenings are important for early detection. - Order a mammogram. - Follow-up Visit on 11/08/2023 - Spent 47 minutes for office visit. Time spent for documentation, reviewing/reconciling medications, reviewing specialist office visit notes/imaging, and addressing other medical issues.   Diagnoses and all orders for this visit:  Atrial flutter, unspecified type (CMS/HHS-HCC) -     Magnesium ; Future  Mixed hyperlipidemia due to type 2 diabetes mellitus  (CMS/HHS-HCC) -     Albumin/Creatinine Ratio, Random Urine -     Creatine Kinase (CK), Total; Future -     Lipid Panel W/Reflex Direct Low Density Lipoprotein (LDL) Cholesterol; Future  Encounter for screening mammogram for breast cancer -     Mammo screening breast tomosynthesis bilateral; Future  Type 2 diabetes mellitus  without complication, without long-term current use of insulin  (CMS/HHS-HCC) -     Comprehensive Metabolic Panel (CMP); Future -     Complete Blood Count (CBC) with Differential; Future -     Hemoglobin A1C; Future -     Ambulatory Referral to Podiatry  Acquired hypothyroidism -     Thyroid  Stimulating Hormone (TSH); Future -     Thyroxine (T4), Free; Future  Open wound of third toe of right foot, initial encounter -     Ambulatory Referral to Wound Management  HTN, goal below  140/80  Compression fracture of L2 vertebra with routine healing           Return in about 24 days (around 11/08/2023) for Follow-up Visit.     Future Appointments     Date/Time Provider Department Center Visit Type   11/08/2023 1:00 PM (Arrive by 12:45 PM) Amelie Crown, DO [facility] OFFICE VISIT       Patient Instructions  Referral to Wound Care Clinic  Obtain labs after office visit  Obtain Mammogram  Follow-up Visit on 11/08/2023  An after visit summary was provided for the patient either in written format (printed) or through [facility].  This note has been created using automated tools and reviewed for accuracy by ANUJ PRASAD.

## 2023-11-15 NOTE — Progress Notes (Signed)
 Chief Complaint:   Chief Complaint  Patient presents with  . Hematuria    Onset 2 days ago - getting worse  . Cough    Onset 4-5 days ago - generalized body aches    Subjective:   Melanie Hammond is a 73 y.o. female here today with her son for several concerns.   For the past 4 to 5 days she has had what she characterizes as a bad cold.  Headache, myalgia, malaise.  Nasal congestion sore throat and cough.  No shortness of breath no vomiting.  Tolerating p.o.  Darden had similar symptoms with a high fever to 104.  She is feeling somewhat better than she was at the onset of illness but not totally improved.  2 days ago she developed hematuria that she noticed in the morning.  Has only noticed in the mornings.  However is now having some dysuria at the end of her stream.  Concern for UTI.  No flank pain, fevers or chills, nausea vomiting, abdominal pain.  Patient's past medical history, current problem list, medications reviewed by me in Epic today.   ROS See HPI  Past medical history:  Active Ambulatory Problems    Diagnosis Date Noted  . Atrial flutter (CMS/HHS-HCC)   . MVP (mitral valve prolapse)   . Hypothyroidism   . HTN, goal below 140/80   . Chronic back pain   . Trochanteric bursitis, left hip 12/01/2019  . Branch retinal vein occlusion (CMS-HCC) 03/04/2011  . GAD (generalized anxiety disorder) 12/19/2013  . Gastroesophageal reflux disease 06/14/2020  . Polypharmacy 04/22/2021  . Tremulousness 04/22/2021  . Type 2 diabetes mellitus without complication, without long-term current use of insulin  (CMS/HHS-HCC) 12/03/2021  . Chronic pain syndrome 12/03/2021  . Elevated parathyroid hormone 12/03/2021  . Primary insomnia 01/10/2022  . Chronic, continuous use of opioids 05/11/2023  . Mixed hyperlipidemia due to type 2 diabetes mellitus  (CMS/HHS-HCC) 06/07/2023  . Major depressive disorder, recurrent, moderate (CMS/HHS-HCC) 07/05/2023  . Open wound of third toe of right foot  10/15/2023  . Compression fracture of L2 vertebra with routine healing 10/15/2023   Resolved Ambulatory Problems    Diagnosis Date Noted  . Nephrolithiasis   . Nephrolithiasis   . Depression   . Closed displaced subtrochanteric fracture of left femur (CMS/HHS-HCC) 12/11/2019  . Closed displaced fracture of medial malleolus of right tibia 12/11/2019  . Abnormal CT of the chest 11/20/2016  . Intertrochanteric fracture of left hip (CMS/HHS-HCC) 12/09/2019  . Mood disorder () 01/10/2022   Past Medical History:  Diagnosis Date  . Anxiety   . H/O cardiac radiofrequency ablation   . History of squamous cell carcinoma   . Hypertension   . Microhematuria     Vitals:   11/15/23 1826  BP: 138/70  Pulse: 70  Resp: 16  Temp: 37 C (98.6 F)  TempSrc: Oral  SpO2: 96%  Weight: 75.4 kg (166 lb 3.6 oz)  PainSc: 0-No pain    Physical Exam Constitutional:      General: She is not in acute distress.    Appearance: Normal appearance. She is well-developed. She is not toxic-appearing.  HENT:     Head: Normocephalic and atraumatic.     Nose: Congestion present.     Mouth/Throat:     Mouth: Mucous membranes are moist.     Pharynx: No oropharyngeal exudate or posterior oropharyngeal erythema.  Eyes:     Conjunctiva/sclera: Conjunctivae normal.     Pupils: Pupils are equal, round, and reactive  to light.  Cardiovascular:     Rate and Rhythm: Normal rate and regular rhythm.  Pulmonary:     Effort: Pulmonary effort is normal. No respiratory distress.     Breath sounds: Normal breath sounds.  Abdominal:     Palpations: Abdomen is soft.     Tenderness: There is no abdominal tenderness. There is no right CVA tenderness, left CVA tenderness or guarding.  Musculoskeletal:        General: Normal range of motion.  Skin:    General: Skin is warm and dry.  Neurological:     Mental Status: She is alert and oriented to person, place, and time.  Psychiatric:        Mood and Affect: Mood normal.         Behavior: Behavior normal.     Labs/Imaging/ECG:   Results for orders placed or performed in visit on 11/15/23  [facility] POC Urinalysis Chemical w/Option to Reflex Urine Culture  Result Value Ref Range   See Comment     Color Yellow Colorless, Straw, Yellow, Light Yellow, Dark Yellow   Clarity SL Cloudy (!) Clear   Specific Gravity 1.015 1.005 - 1.030   pH, Urine 6.0 5.0 - 8.0   Protein, Urinalysis 2+ (!) Negative   Glucose, Urinalysis Negative Negative   Ketones, Urinalysis Negative Negative   Blood, Urinalysis Trace (!) Negative   Nitrite, Urinalysis Positive (!) Negative   Leukocytes, Urinalysis 2+ (!) Negative   Bilirubin, Urinalysis Negative Negative   Urobilinogen, Urinalysis 0.2 0.2 - 1.0 mg/dL   Narrative   POC TEST(S) ABOVE PERFORMED AT THE PATIENT CARE LOCATION AND OVERSEEN BY THE [facility] POCT PROGRAM.   POC Coronavirus (COVID-19) SARS-Cov-2 Rapid Test  Result Value Ref Range   POC Coronavirus (COVID-19) SARS-CoV-2 Rapid Test Not Detected Not Detected   Narrative   Your COVID-19 coronavirus test was negative (i.e., the COVID-19 coronavirus was NOT detected in your sample). This means you are unlikely to be infected with the COVID-19 coronavirus unless you have recent exposures to a known COVID-19-positive person or new symptoms since the sample was collected. If your symptoms worsen, please call your provider's office during business hours, or the main [facility] COVID hotline at [phone] (weekdays 8am-5pm) and Employee Health COVID hotline (option 1) (weekdays 8am-5pm and weekends 8am-12pm). If you are experiencing a life-threatening emergency, please call 911.    Assessment/Plan:   MDM:  Clinically well-appearing 73 year old female here with urinary symptoms as well as URI symptoms.  She has stable side vital signs and a reassuring exam.  Hematuria, dysuria: Urinalysis suggestive of infection, nitrite and leuk positive.  Patient with a reassuring exam not concerning for  pyelonephritis or urosepsis.  Treat empirically with cefuroxime, last urine culture that I can see per chart review grew pansensitive [name].  Follow culture results, discussed supportive care, push fluids.  Cough, body aches, congestion: COVID-negative.  Lungs clear to auscultation, no respiratory distress.  No fevers.  Reassuringly  Patient is feeling better than she was earlier in this illness.  Grandson was ill with similar symptoms, I suspect that this is viral.  Not concerning for bacterial sinusitis or pneumonia.  Tessalon Perles for cough, further supportive care for URI symptoms as below, follow-up should symptoms worsen or fail to continue to improve.    ICD-10-CM   1. Acute cystitis with hematuria  N30.01 [facility] POC Urinalysis Chemical w/Option to Reflex Urine Culture    CULTURE, URINE, REFLEXED FROM PYURIA SCREEN    2.  Viral upper respiratory tract infection with cough  J06.9 POC Coronavirus (COVID-19) SARS-Cov-2 Rapid Test      Requested Prescriptions   Signed Prescriptions Disp Refills  . cefUROXime (CEFTIN) 250 MG tablet 14 tablet 0    Sig: Take 1 tablet (250 mg total) by mouth 2 (two) times daily for 7 days  . benzonatate (TESSALON) 200 MG capsule 30 capsule 0    Sig: Take 1 capsule (200 mg total) by mouth 3 (three) times daily as needed for Cough   Drink plenty of clear fluids Full course of cefuroxime as directed. You may experience 24-48 hours of continuing discomfort until medication controls the infection If you develop a fever, back pain, abdominal pain or vomiting, get re-examined, you may have a more serious upper kidney infection We will call if your urine culture requires change in antibiotics     Rest, hydration, and treating your symptoms are the keys to feeling better while your body fights off a viral illness.   Over-the-counter decongestants, like Sudafed, may help relieve stuffy nose and congestion. If you have high blood pressure, Corcidin HBP is a good  choice (does not impact BP).   Tessalon Perles as directed for cough. Honey is also an effective natural cough suppressant (do not give honey to children under 1 year of age).  Look for cough drops containing pectin or slippery elm, which can help hydrate and soothe the throat. You can try to suppress the urge to cough by taking a hard swallow and turning your head to the side, also try swallowing warm liquids.   Mucinex can help a cough that is producing a lot of mucus. It helps to thin out the mucus to make it easier to cough up. (Mucinex 'D', from the pharmacist, has a decongestant in it; Mucinex 'DM' has a cough suppressant in it.) Take any Mucinex with a full glass of water and drink plenty of fluids.   Can take ibuprofen (if there is not a reason you cannot take this) or acetaminophen  as directed over the counter for fever, body aches, sore throat pain.   Don't forget about non pharmacological remedies such as hot tea (try peppermint or ginger) with honey and lemon, steamy showers, a cool mist humidifier in the bedroom, saline nasal spray and/or neti pot, lots of fluids and rest, and saltwater gargles for a sore throat. If you aren't hungry make sure you're drinking fluids with electrolytes (Gatorade, Pedialyte, soups/broth).   Viral illnesses can last up to 2 weeks or so, and an associated post viral cough can persist for even longer. If your symptoms fail to slowly improve over this time frame, or if you begin to feel sicker (you develop new fever, facial or dental pain, vomiting, coughing up bloody or brown sputum, or chest pain/shortness of breath), seek medical care.      A copy of these instructions have been given to the patient or responsible adult who demonstrated the ability to learn.  Discharge instructions and ED precautions discussed and all questions answered.     Jenna Fowlkes, MSN, NP  (This note was partially made with the aid of speech-to-text dictation. Please excuse  any errors)   This note has been created using automated tools and reviewed for accuracy by [facility].

## 2023-11-20 NOTE — Telephone Encounter (Signed)
 Opening statement given, HIPAA verified x3  See Initial Assessment Questions for details of symptoms.  Denies: recent injury Dispo: (to call back with new or worsening sx)  POC Donzell Collard to call [facility] and clarify rx instructions, per what PCP instructed POC also sending fax to [facility] with PCP specific instructions  Trying to get med filled for one week  Now pharmacy can not fill because directions were not included, Triage RN sees instructions on file  Patient out of rx for almost a week  Lower back down the middle, also left foot and hip also  Caller verbalized understanding and agrees to follow care advice/disposition per triage protocol. Closing statement provided.  Reason for Disposition . [1] MODERATE back pain (e.g., interferes with normal activities) AND [2] present > 3 days  Additional Information . Negative: Drug overdose and nurse unable to answer question . Negative: Passed out (i.e., lost consciousness, collapsed and was not responding) . Negative: Shock suspected (e.g., cold/pale/clammy skin, too weak to stand, low BP, rapid pulse) . Negative: Sounds like a life-threatening emergency to the triager . Negative: Major injury to the back (e.g., MVA, fall > 10 feet or 3 meters, penetrating injury, etc.) . Negative: Followed a tailbone injury . Negative: [1] Pain in the upper back over the ribs (rib cage) AND [2] radiates (travels, goes) into chest . Negative: [1] Pain in the upper back over the ribs (rib cage) AND [2] worsened by coughing (or clearly increases with breathing) . Negative: Back pain during pregnancy . Negative: Pain mainly in flank (i.e., in the side, over the lower ribs or just below the ribs) . Negative: [1] SEVERE back pain (e.g., excruciating) AND [2] sudden onset AND [3] age > 105 . Negative: [1] Unable to urinate (or only a few drops) > 4 hours AND     [2] bladder feels very full (e.g., palpable bladder or strong urge to urinate) . Negative:  [1] Urinary or bowel incontinence (i.e., loss of bladder or bowel control) AND [2] new onset . Negative: Numbness in groin or rectal area (i.e., loss of sensation) . Negative: [1] SEVERE abdominal pain AND [2] present > 1 hour . Negative: [1] Abdominal pain AND [2] age > 26 . Negative: Weakness of a leg or foot (e.g., unable to bear weight, dragging foot) . Negative: Unable to walk . Negative: Patient sounds very sick or weak to the triager . Negative: [1] SEVERE back pain (e.g., excruciating, unable to do any normal activities) AND [2] not improved 2 hours after pain medicine . Negative: [1] Pain radiates into the thigh or further down the leg AND [2] both legs . Negative: [1] Fever > 100.5 F (38.1 C) AND [2] flank pain (i.e., in side, below ribs and above hip) . Negative: [1] Pain or burning with urination AND [2] flank pain (i.e., in side, below ribs and above hip) . Negative: Numbness in a leg or foot (i.e., loss of sensation) . Negative: High-risk adult (e.g., history of cancer, HIV, or IV drug abuse) . Negative: [1] Fever AND [2] no symptoms of UTI  (Exception: has generalized muscle pains, not localized back pain) . Negative: Rash in same area as pain (may be described as small blisters) . Negative: Blood in urine (red, pink, or tea-colored)  Answer Assessment - Initial Assessment Questions 1. SYMPTOMS: Do you have any symptoms? pain, moderate to severe all over hip leg and foot  2. SEVERITY: If symptoms are present, ask Are they mild, moderate or severe? see  above  Answer Assessment - Initial Assessment Questions 1. ONSET: When did the pain begin?  worsening, was okay after surgery but sowly giving away  2. LOCATION: Where does it hurt? (upper, mid or lower back) see note  3. SEVERITY: How bad is the pain?  (e.g., Scale 1-10; mild, moderate, or severe)   - MILD (1-3): doesn't interfere with normal activities    - MODERATE (4-7): interferes with normal activities  or awakens from sleep    - SEVERE (8-10): excruciating pain, unable to do any normal activities  close to severe  4. PATTERN: Is the pain constant? (e.g., yes, no; constant, intermittent)  eases up sometimes some when lays down   5. RADIATION: Does the pain shoot into your legs or elsewhere? yes, into leg  6. CAUSE:  What do you think is causing the back pain?  surgery on leg is worsening and back has always some but is worse with age  68. BACK OVERUSE:  Any recent lifting of heavy objects, strenuous work or exercise? no  8. MEDICATIONS: What have you taken so far for the pain? (e.g., nothing, acetaminophen , NSAIDS) nothing for a week, did not have med  9. NEUROLOGIC SYMPTOMS: Do you have any weakness, numbness, or problems with bowel/bladder control? bladder problem improving with med, left hand feels kind of numb  10. OTHER SYMPTOMS: Do you have any other symptoms? (e.g., fever, abdominal pain, burning with urination, blood in urine) had burning in blood in urine but on antbx for UTI, sx have improved  11. PREGNANCY: Is there any chance you are pregnant? (e.g., yes, no; LMP) na  Protocols used: Medication Question Call-A-AH, Back Pain-A-AH

## 2023-11-23 NOTE — Telephone Encounter (Signed)
 Spoke with patients daughter by phone.   Patients daughter states that the on call provider was able to clarify the Oxycodone  Rx and confirmed that the patient was able to get her medication filled.  In the future, patients daughter states that patient is taking OxyCODONE -acetaminophen  (PERCOCET) 5-325 mg tablet three times a day, 1 tab in the am, 1 tab at lunch, and 1 tab before bedtime.

## 2023-12-27 ENCOUNTER — Inpatient Hospital Stay
Admission: EM | Admit: 2023-12-27 | Discharge: 2023-12-30 | DRG: 872 | Disposition: A | Discharge status: Home Health | Attending: Internal Medicine | Admitting: Internal Medicine

## 2023-12-27 ENCOUNTER — Emergency Department

## 2023-12-27 ENCOUNTER — Other Ambulatory Visit: Payer: Self-pay

## 2023-12-27 DIAGNOSIS — R6520 Severe sepsis without septic shock: Secondary | ICD-10-CM | POA: Diagnosis present

## 2023-12-27 DIAGNOSIS — F17210 Nicotine dependence, cigarettes, uncomplicated: Secondary | ICD-10-CM | POA: Diagnosis present

## 2023-12-27 DIAGNOSIS — Z79890 Hormone replacement therapy (postmenopausal): Secondary | ICD-10-CM

## 2023-12-27 DIAGNOSIS — A4151 Sepsis due to Escherichia coli [E. coli]: Principal | ICD-10-CM | POA: Diagnosis present

## 2023-12-27 DIAGNOSIS — A419 Sepsis, unspecified organism: Secondary | ICD-10-CM | POA: Diagnosis not present

## 2023-12-27 DIAGNOSIS — N390 Urinary tract infection, site not specified: Secondary | ICD-10-CM | POA: Diagnosis not present

## 2023-12-27 DIAGNOSIS — Z7901 Long term (current) use of anticoagulants: Secondary | ICD-10-CM

## 2023-12-27 DIAGNOSIS — D696 Thrombocytopenia, unspecified: Secondary | ICD-10-CM | POA: Diagnosis present

## 2023-12-27 DIAGNOSIS — R4182 Altered mental status, unspecified: Secondary | ICD-10-CM

## 2023-12-27 DIAGNOSIS — Y92009 Unspecified place in unspecified non-institutional (private) residence as the place of occurrence of the external cause: Secondary | ICD-10-CM

## 2023-12-27 DIAGNOSIS — Z7982 Long term (current) use of aspirin: Secondary | ICD-10-CM

## 2023-12-27 DIAGNOSIS — Z1152 Encounter for screening for COVID-19: Secondary | ICD-10-CM

## 2023-12-27 DIAGNOSIS — I4892 Unspecified atrial flutter: Secondary | ICD-10-CM | POA: Diagnosis present

## 2023-12-27 DIAGNOSIS — W19XXXA Unspecified fall, initial encounter: Principal | ICD-10-CM | POA: Diagnosis present

## 2023-12-27 DIAGNOSIS — W19XXXS Unspecified fall, sequela: Secondary | ICD-10-CM

## 2023-12-27 DIAGNOSIS — E876 Hypokalemia: Secondary | ICD-10-CM | POA: Diagnosis not present

## 2023-12-27 DIAGNOSIS — Z7984 Long term (current) use of oral hypoglycemic drugs: Secondary | ICD-10-CM

## 2023-12-27 DIAGNOSIS — F0390 Unspecified dementia without behavioral disturbance: Secondary | ICD-10-CM | POA: Diagnosis present

## 2023-12-27 DIAGNOSIS — I10 Essential (primary) hypertension: Secondary | ICD-10-CM | POA: Diagnosis present

## 2023-12-27 DIAGNOSIS — Z8249 Family history of ischemic heart disease and other diseases of the circulatory system: Secondary | ICD-10-CM

## 2023-12-27 DIAGNOSIS — I480 Paroxysmal atrial fibrillation: Secondary | ICD-10-CM | POA: Diagnosis present

## 2023-12-27 DIAGNOSIS — E872 Acidosis, unspecified: Secondary | ICD-10-CM | POA: Diagnosis present

## 2023-12-27 DIAGNOSIS — E663 Overweight: Secondary | ICD-10-CM | POA: Diagnosis present

## 2023-12-27 DIAGNOSIS — Z6826 Body mass index (BMI) 26.0-26.9, adult: Secondary | ICD-10-CM

## 2023-12-27 DIAGNOSIS — E119 Type 2 diabetes mellitus without complications: Secondary | ICD-10-CM | POA: Diagnosis present

## 2023-12-27 DIAGNOSIS — Z79899 Other long term (current) drug therapy: Secondary | ICD-10-CM

## 2023-12-27 LAB — URINALYSIS, W/ REFLEX TO CULTURE (INFECTION SUSPECTED)
Bilirubin Urine: NEGATIVE
Glucose, UA: 50 mg/dL — AB
Ketones, ur: NEGATIVE mg/dL
Nitrite: POSITIVE — AB
Protein, ur: 30 mg/dL — AB
Specific Gravity, Urine: 1.011 (ref 1.005–1.030)
WBC, UA: >50 WBC/hpf (ref 0–5)
pH: 7 (ref 5.0–8.0)

## 2023-12-27 LAB — BLOOD CULTURE ID PANEL (REFLEXED) - BCID2

## 2023-12-27 LAB — COMPREHENSIVE METABOLIC PANEL WITH GFR
ALT: 13 U/L (ref 0–44)
AST: 18 U/L (ref 15–41)
Albumin: 3.7 g/dL (ref 3.5–5.0)
Alkaline Phosphatase: 44 U/L (ref 38–126)
Anion gap: 13 (ref 5–15)
BUN: 8 mg/dL (ref 8–23)
CO2: 22 mmol/L (ref 22–32)
Calcium: 9.3 mg/dL (ref 8.9–10.3)
Chloride: 99 mmol/L (ref 98–111)
Creatinine, Ser: 0.71 mg/dL (ref 0.44–1.00)
GFR, Estimated: >60 mL/min (ref >60)
Glucose, Bld: 180 mg/dL — ABNORMAL HIGH (ref 70–99)
Potassium: 3.6 mmol/L (ref 3.5–5.1)
Sodium: 134 mmol/L — ABNORMAL LOW (ref 135–145)
Total Bilirubin: 0.6 mg/dL (ref 0.0–1.2)
Total Protein: 7.3 g/dL (ref 6.5–8.1)

## 2023-12-27 LAB — CBC WITH DIFFERENTIAL/PLATELET
Abs Immature Granulocytes: 0.06 K/uL (ref 0.00–0.07)
Basophils Absolute: 0 K/uL (ref 0.0–0.1)
Basophils Relative: 0 %
Eosinophils Absolute: 0 K/uL (ref 0.0–0.5)
Eosinophils Relative: 0 %
HCT: 39.2 % (ref 36.0–46.0)
Hemoglobin: 13.5 g/dL (ref 12.0–15.0)
Immature Granulocytes: 0 %
Lymphocytes Relative: 5 %
Lymphs Abs: 0.6 K/uL — ABNORMAL LOW (ref 0.7–4.0)
MCH: 29.5 pg (ref 26.0–34.0)
MCHC: 34.4 g/dL (ref 30.0–36.0)
MCV: 85.6 fL (ref 80.0–100.0)
Monocytes Absolute: 0.7 K/uL (ref 0.1–1.0)
Monocytes Relative: 5 %
Neutro Abs: 12.2 K/uL — ABNORMAL HIGH (ref 1.7–7.7)
Neutrophils Relative %: 90 %
Platelets: 190 K/uL (ref 150–400)
RBC: 4.58 MIL/uL (ref 3.87–5.11)
RDW: 15.4 % (ref 11.5–15.5)
WBC: 13.7 K/uL — ABNORMAL HIGH (ref 4.0–10.5)
nRBC: 0 % (ref 0.0–0.2)

## 2023-12-27 LAB — URINE DRUG SCREEN, QUALITATIVE (ARMC ONLY)
Amphetamines, Ur Screen: NOT DETECTED
Barbiturates, Ur Screen: NOT DETECTED
Benzodiazepine, Ur Scrn: NOT DETECTED
Cannabinoid 50 Ng, Ur ~~LOC~~: NOT DETECTED
Cocaine Metabolite,Ur ~~LOC~~: NOT DETECTED
MDMA (Ecstasy)Ur Screen: NOT DETECTED
Methadone Scn, Ur: NOT DETECTED
Opiate, Ur Screen: NOT DETECTED
Phencyclidine (PCP) Ur S: NOT DETECTED
Tricyclic, Ur Screen: POSITIVE — AB

## 2023-12-27 LAB — RESP PANEL BY RT-PCR (RSV, FLU A&B, COVID)  RVPGX2
Influenza A by PCR: NEGATIVE
Influenza B by PCR: NEGATIVE
Resp Syncytial Virus by PCR: NEGATIVE
SARS Coronavirus 2 by RT PCR: NEGATIVE

## 2023-12-27 LAB — PROTIME-INR
INR: 1.2 (ref 0.8–1.2)
Prothrombin Time: 16 s — ABNORMAL HIGH (ref 11.4–15.2)

## 2023-12-27 LAB — LACTIC ACID, PLASMA
Lactic Acid, Venous: 2.4 mmol/L (ref 0.5–1.9)
Lactic Acid, Venous: 1.9 mmol/L (ref 0.5–1.9)

## 2023-12-27 LAB — TROPONIN I (HIGH SENSITIVITY)
Troponin I (High Sensitivity): 10 ng/L (ref <18)
Troponin I (High Sensitivity): 14 ng/L (ref <18)

## 2023-12-27 LAB — T4, FREE: Free T4: 1.41 ng/dL — ABNORMAL HIGH (ref 0.61–1.12)

## 2023-12-27 LAB — HEMOGLOBIN A1C
Hgb A1c MFr Bld: 5.7 % — ABNORMAL HIGH (ref 4.8–5.6)
Mean Plasma Glucose: 116.89 mg/dL

## 2023-12-27 LAB — TSH: TSH: 2.054 u[IU]/mL (ref 0.350–4.500)

## 2023-12-27 LAB — MAGNESIUM: Magnesium: 1.6 mg/dL — ABNORMAL LOW (ref 1.7–2.4)

## 2023-12-27 LAB — GLUCOSE, CAPILLARY
Glucose-Capillary: 121 mg/dL — ABNORMAL HIGH (ref 70–99)
Glucose-Capillary: 158 mg/dL — ABNORMAL HIGH (ref 70–99)
Glucose-Capillary: 119 mg/dL — ABNORMAL HIGH (ref 70–99)
Glucose-Capillary: 133 mg/dL — ABNORMAL HIGH (ref 70–99)

## 2023-12-27 LAB — ETHANOL: Alcohol, Ethyl (B): <15 mg/dL (ref <15)

## 2023-12-27 LAB — CORTISOL-AM, BLOOD: Cortisol - AM: 13.1 ug/dL (ref 6.7–22.6)

## 2023-12-27 MED ORDER — INSULIN ASPART 100 UNIT/ML IJ SOLN
0.0000 [IU] | Freq: Three times a day (TID) | INTRAMUSCULAR | Status: DC
  Administered 2023-12-27: 2 [IU] via SUBCUTANEOUS
  Administered 2023-12-27: 3 [IU] via SUBCUTANEOUS
  Administered 2023-12-28 (×2): 2 [IU] via SUBCUTANEOUS
  Filled 2023-12-27 (×4): qty 1

## 2023-12-27 MED ORDER — ACETAMINOPHEN 325 MG PO TABS
650.0000 mg | ORAL_TABLET | Freq: Once | ORAL | Status: AC
  Administered 2023-12-27: 650 mg via ORAL
  Filled 2023-12-27: qty 2

## 2023-12-27 MED ORDER — HYDROCODONE-ACETAMINOPHEN 5-325 MG PO TABS
1.0000 | ORAL_TABLET | ORAL | Status: DC | PRN
  Administered 2023-12-27 – 2023-12-29 (×2): 2 via ORAL
  Filled 2023-12-27 (×2): qty 2

## 2023-12-27 MED ORDER — SODIUM CHLORIDE 0.9 % IV SOLN
1.0000 g | Freq: Once | INTRAVENOUS | Status: AC
  Administered 2023-12-27: 1 g via INTRAVENOUS
  Filled 2023-12-27: qty 10

## 2023-12-27 MED ORDER — ONDANSETRON HCL 4 MG PO TABS: 4.0000 mg | ORAL_TABLET | Freq: Four times a day (QID) | ORAL | Status: DC | PRN

## 2023-12-27 MED ORDER — TRAZODONE HCL 50 MG PO TABS
150.0000 mg | ORAL_TABLET | Freq: Every day | ORAL | Status: DC
  Administered 2023-12-27 – 2023-12-29 (×3): 150 mg via ORAL
  Filled 2023-12-27 (×3): qty 1

## 2023-12-27 MED ORDER — SERTRALINE HCL 50 MG PO TABS
150.0000 mg | ORAL_TABLET | Freq: Every day | ORAL | Status: DC
Start: 2023-12-27 — End: 2023-12-30
  Administered 2023-12-27 – 2023-12-30 (×4): 150 mg via ORAL
  Filled 2023-12-27 (×4): qty 3

## 2023-12-27 MED ORDER — RIVAROXABAN 20 MG PO TABS
20.0000 mg | ORAL_TABLET | Freq: Every day | ORAL | Status: DC
  Administered 2023-12-27 – 2023-12-30 (×4): 20 mg via ORAL
  Filled 2023-12-27 (×5): qty 1

## 2023-12-27 MED ORDER — LEVOTHYROXINE SODIUM 50 MCG PO TABS
50.0000 ug | ORAL_TABLET | Freq: Every day | ORAL | Status: DC
  Administered 2023-12-27 – 2023-12-30 (×4): 50 ug via ORAL
  Filled 2023-12-27 (×4): qty 1

## 2023-12-27 MED ORDER — MAGNESIUM SULFATE 2 GM/50ML IV SOLN
2.0000 g | Freq: Once | INTRAVENOUS | Status: AC
  Administered 2023-12-27: 2 g via INTRAVENOUS
  Filled 2023-12-27: qty 50

## 2023-12-27 MED ORDER — CARVEDILOL 25 MG PO TABS
25.0000 mg | ORAL_TABLET | Freq: Once | ORAL | Status: AC
  Administered 2023-12-27: 25 mg via ORAL
  Filled 2023-12-27: qty 1

## 2023-12-27 MED ORDER — INSULIN ASPART 100 UNIT/ML IJ SOLN: 0.0000 [IU] | Freq: Every day | INTRAMUSCULAR | Status: DC

## 2023-12-27 MED ORDER — ALBUTEROL SULFATE (2.5 MG/3ML) 0.083% IN NEBU: 2.5000 mg | INHALATION_SOLUTION | RESPIRATORY_TRACT | Status: DC | PRN

## 2023-12-27 MED ORDER — ACETAMINOPHEN 325 MG PO TABS: 650.0000 mg | ORAL_TABLET | Freq: Four times a day (QID) | ORAL | Status: DC | PRN

## 2023-12-27 MED ORDER — AMITRIPTYLINE HCL 25 MG PO TABS
100.0000 mg | ORAL_TABLET | Freq: Every day | ORAL | Status: DC
  Administered 2023-12-27 – 2023-12-29 (×3): 100 mg via ORAL
  Filled 2023-12-27 (×3): qty 4

## 2023-12-27 MED ORDER — ONDANSETRON HCL 4 MG/2ML IJ SOLN: 4.0000 mg | Freq: Four times a day (QID) | INTRAMUSCULAR | Status: DC | PRN

## 2023-12-27 MED ORDER — ASPIRIN 81 MG PO CHEW
81.0000 mg | CHEWABLE_TABLET | Freq: Every day | ORAL | Status: DC
  Administered 2023-12-27 – 2023-12-30 (×4): 81 mg via ORAL
  Filled 2023-12-27 (×4): qty 1

## 2023-12-27 MED ORDER — ACETAMINOPHEN 650 MG RE SUPP: 650.0000 mg | Freq: Four times a day (QID) | RECTAL | Status: DC | PRN

## 2023-12-27 MED ORDER — LACTATED RINGERS IV SOLN
150.0000 mL/h | INTRAVENOUS | Status: DC
  Administered 2023-12-27: 150 mL/h via INTRAVENOUS

## 2023-12-27 MED ORDER — LATANOPROST 0.005 % OP SOLN
1.0000 [drp] | Freq: Every day | OPHTHALMIC | Status: DC
  Administered 2023-12-27 – 2023-12-29 (×3): 1 [drp] via OPHTHALMIC
  Filled 2023-12-27 (×2): qty 2.5

## 2023-12-27 MED ORDER — SODIUM CHLORIDE 0.9 % IV BOLUS
1000.0000 mL | Freq: Once | INTRAVENOUS | Status: AC
  Administered 2023-12-27: 1000 mL via INTRAVENOUS

## 2023-12-27 MED ORDER — IRBESARTAN 150 MG PO TABS
300.0000 mg | ORAL_TABLET | Freq: Every day | ORAL | Status: DC
Start: 2023-12-27 — End: 2023-12-30
  Administered 2023-12-27 – 2023-12-30 (×4): 300 mg via ORAL
  Filled 2023-12-27 (×4): qty 2

## 2023-12-27 MED ORDER — DORZOLAMIDE HCL-TIMOLOL MAL PF 22.3-6.8 MG/ML OP SOLN
1.0000 [drp] | Freq: Two times a day (BID) | OPHTHALMIC | Status: DC
  Administered 2023-12-27 – 2023-12-30 (×6): 1 [drp] via OPHTHALMIC
  Filled 2023-12-27 (×9): qty 0.1

## 2023-12-27 MED ORDER — FAMOTIDINE 20 MG PO TABS
20.0000 mg | ORAL_TABLET | Freq: Two times a day (BID) | ORAL | Status: DC
  Administered 2023-12-27 – 2023-12-30 (×6): 20 mg via ORAL
  Filled 2023-12-27 (×6): qty 1

## 2023-12-27 MED ORDER — ROSUVASTATIN CALCIUM 5 MG PO TABS
5.0000 mg | ORAL_TABLET | Freq: Every day | ORAL | Status: DC
  Administered 2023-12-27 – 2023-12-30 (×4): 5 mg via ORAL
  Filled 2023-12-27 (×4): qty 1

## 2023-12-27 MED ORDER — SODIUM CHLORIDE 0.9 % IV SOLN
1.0000 g | INTRAVENOUS | Status: DC
  Administered 2023-12-28: 1 g via INTRAVENOUS
  Filled 2023-12-27: qty 10

## 2023-12-27 MED ORDER — CARISOPRODOL 350 MG PO TABS
350.0000 mg | ORAL_TABLET | Freq: Every day | ORAL | Status: DC
  Administered 2023-12-27 – 2023-12-29 (×3): 350 mg via ORAL
  Filled 2023-12-27 (×3): qty 1

## 2023-12-27 MED ORDER — BUPROPION HCL ER (XL) 150 MG PO TB24
150.0000 mg | ORAL_TABLET | Freq: Every day | ORAL | Status: DC
  Administered 2023-12-27 – 2023-12-30 (×4): 150 mg via ORAL
  Filled 2023-12-27 (×4): qty 1

## 2023-12-27 MED ORDER — BUSPIRONE HCL 10 MG PO TABS
15.0000 mg | ORAL_TABLET | Freq: Every day | ORAL | Status: DC
  Administered 2023-12-27 – 2023-12-30 (×4): 15 mg via ORAL
  Filled 2023-12-27 (×4): qty 2

## 2023-12-27 MED ORDER — FLUTICASONE FUROATE-VILANTEROL 100-25 MCG/ACT IN AEPB
1.0000 | INHALATION_SPRAY | Freq: Every day | RESPIRATORY_TRACT | Status: DC
  Administered 2023-12-28 – 2023-12-29 (×2): 1 via RESPIRATORY_TRACT
  Filled 2023-12-27 (×2): qty 28

## 2023-12-27 MED ORDER — CARISOPRODOL 350 MG PO TABS: 350.0000 mg | ORAL_TABLET | Freq: Three times a day (TID) | ORAL | Status: DC

## 2023-12-27 NOTE — Assessment & Plan Note (Signed)
 Chronic anticoagulation Continue Xarelto , continue metoprolol  No evidence of injury from fall

## 2023-12-27 NOTE — Progress Notes (Signed)
   Interim no charge progress note   This patient, Melanie Hammond is a 73 y.o. female who was admitted by colleague earlier this morning for UTI.  Upon my evaluation this morning she is pleasantly confused.  She has just vomited.  She reports that her daughter is sick at home and she believes she has contracted the same virus.  She is oriented to self and situation but disoriented to year.  She reports the current date is Aug 06, 2019, reports the president is currently a crazy man which she later clarifies as [name].  For now we will continue with current ceftriaxone therapy and repeat labs in AM.       12/27/2023    7:56 AM 12/27/2023    7:54 AM 12/27/2023    7:22 AM  Vitals with BMI  Systolic 143 123 859  Diastolic 70 50 50  Pulse 83 82 81       Latest Ref Rng & Units 12/27/2023    2:17 AM 03/01/2022    2:29 PM 12/14/2019    5:04 AM  CBC  WBC 4.0 - 10.5 K/uL 13.7  8.0  4.6   Hemoglobin 12.0 - 15.0 g/dL 86.4  86.2  7.6   Hematocrit 36.0 - 46.0 % 39.2  41.5  22.7   Platelets 150 - 400 K/uL 190  204  165        Latest Ref Rng & Units 12/27/2023    2:17 AM 03/01/2022    2:29 PM 12/14/2019    5:04 AM  BMP  Glucose 70 - 99 mg/dL 819  836  866   BUN 8 - 23 mg/dL 8  12  16    Creatinine 0.44 - 1.00 mg/dL 9.28  9.27  9.29   Sodium 135 - 145 mmol/L 134  132  134   Potassium 3.5 - 5.1 mmol/L 3.6  3.8  4.0   Chloride 98 - 111 mmol/L 99  102  101   CO2 22 - 32 mmol/L 22  20  27    Calcium 8.9 - 10.3 mg/dL 9.3  9.7  9.0

## 2023-12-27 NOTE — ED Notes (Signed)
 Called CCMD for pt monitoring

## 2023-12-27 NOTE — Assessment & Plan Note (Signed)
 Sepsis criteria include fever, tachycardia with leukocytosis and lactic acidosis Continue sepsis fluids Rocephin Follow cultures

## 2023-12-27 NOTE — Assessment & Plan Note (Signed)
 Delirium precautions Resume home meds pending med rec

## 2023-12-27 NOTE — ED Triage Notes (Signed)
 Pt arrived via EMS from home. Pt fell on floor beside her bed. Reports she dell d/t dizziness. Denies hitting head. Denies pain. EMS reported episode of emesis on the way here. A/O x3, not oriented to time. EMS reports pt family report pt has early onset dementia. Pt endorses burning while urinating. EMS reports their 12-lead ECG showed Right Bundle Branch Block w/possible A futter. Pt is on Xarelto  at home.  EMS vitals: Oral Temp 100.5 F BP 150/70 sitting HR 130 SPO2 95% RA BG 169 End Tidal 30 RR 24-30  EMS meds: 4mg  IV Zofran  1000 IV tylenol   500 mL sodium chloride  bolus

## 2023-12-27 NOTE — H&P (Signed)
 History and Physical    PatientKarlina Hammond FMW:[id number] DOB: [birth year] DOA: 12/27/2023 DOS: the patient was seen and examined on 12/27/2023 PCP: Rea Myna Barrows, MD  Patient coming from: Home  Chief Complaint:  Chief Complaint  Patient presents with   Fall    HPI: Melanie Hammond is a 73 y.o. female with medical history significant for DM, HTN, A-flutter on [facility], dementia being admitted with sepsis secondary to UTI.  She presented following a fall preceded by dizziness.  Had an episode of emesis on the way to the hospital.  Patient endorses burning on urination.  She has had no fever, abdominal pain or change in bowel habit. In the ED, febrile to 101, heart rate 135 with otherwise normal vitals. Labs notable for WBC of 13,000 with lactic acid 2.4 Urinalysis with positive nitrites large leuks and rare bacteria. CMP unremarkable An EKG showed sinus tachycardia at 133. Chest x-ray nonacute CT head and C-spine and x-ray pelvis nontraumatic Patient treated with sepsis fluids started on Rocephin Also given magnesium  repletion for low mag of 1.6. Admission requested.     Past Medical History:  Diagnosis Date   Diabetes mellitus without complication (HCC)    Hypertension    Past Surgical History:  Procedure Laterality Date   INTRAMEDULLARY (IM) NAIL INTERTROCHANTERIC Left 12/09/2019   Procedure: INTRAMEDULLARY (IM) NAIL INTERTROCHANTRIC;  Surgeon: Edie Norleen PARAS, MD;  Location: [facility] ORS;  Service: Orthopedics;  Laterality: Left;   ORIF ANKLE FRACTURE Right 12/11/2019   Procedure: OPEN REDUCTION INTERNAL FIXATION (ORIF) ANKLE FRACTURE;  Surgeon: Edie Norleen PARAS, MD;  Location: [facility] ORS;  Service: Orthopedics;  Laterality: Right;   Social History:  reports that she has been smoking. She has a 5 pack-year smoking history. She has never used smokeless tobacco. She reports that she does not use drugs. No history on file for alcohol use.  Allergies  Allergen Reactions   Compazine  [Prochlorperazine]    Thorazine [Chlorpromazine]     Family History  Problem Relation Age of Onset   Hypertension Mother    Hypertension Father     Prior to Admission medications   Medication Sig Start Date End Date Taking? Authorizing Provider  albuterol  (VENTOLIN  HFA) 108 (90 Base) MCG/ACT inhaler Inhale 1-2 puffs into the lungs every 6 (six) hours as needed for wheezing or shortness of breath.    [provider]  amitriptyline  (ELAVIL ) 100 MG tablet Take 100 mg by mouth at bedtime.    [provider]  amLODipine  (NORVASC ) 10 MG tablet Take 1 tablet by mouth daily. 07/04/21   [provider]  ascorbic acid  (VITAMIN C ) 500 MG tablet Take 500 mg by mouth daily.    [provider]  aspirin  81 MG chewable tablet Chew 81 mg by mouth daily.    [provider]  bimatoprost (LUMIGAN) 0.03 % ophthalmic solution Apply 1 drop to eye every evening.    [provider]  buPROPion  (WELLBUTRIN  XL) 150 MG 24 hr tablet Take 150 mg by mouth daily.    [provider]  busPIRone  (BUSPAR ) 15 MG tablet Take 15 mg by mouth in the morning and at bedtime.    [provider]  carisoprodol  (SOMA ) 350 MG tablet Take 350 mg by mouth 3 (three) times daily. 10/16/19   [provider]  Cholecalciferol  25 MCG (1000 UT) capsule Take 1,000 Units by mouth daily.    [provider]  donepezil  (ARICEPT ) 5 MG tablet Take 5 mg by mouth every evening.  [provider]  dorzolamidel-timolol  (COSOPT ) 22.3-6.8 MG/ML SOLN ophthalmic solution Place 1 drop into both eyes 2 (two) times daily.    [provider]  famotidine (PEPCID) 20 MG tablet Take 20 mg by mouth 2 (two) times daily.    [provider]  fluticasone  furoate-vilanterol (BREO ELLIPTA ) 100-25 MCG/INH AEPB Inhale 1 puff into the lungs daily. 01/15/17   [provider]  folic acid  (FOLVITE ) 800 MCG tablet Take 400 mcg by mouth daily.    [provider]  irbesartan  (AVAPRO ) 300 MG tablet Take 300 mg by mouth daily.    [provider]  levothyroxine  (SYNTHROID ) 50 MCG tablet Take 50 mcg by mouth daily. 09/29/19   [provider]  metFORMIN (GLUCOPHAGE) 500 MG tablet Take 500 mg by mouth daily with breakfast. 10/04/19 03/01/22  [provider]  metoprolol  tartrate (LOPRESSOR ) 25 MG tablet Take 1 tablet (25 mg total) by mouth 2 (two) times daily. 12/14/19 01/13/20  Trudy Anthony HERO, MD  mirtazapine (REMERON) 7.5 MG tablet Take 7.5 mg by mouth at bedtime. 02/10/22 02/10/23  [provider]  naloxone [facility]) nasal spray 4 mg/0.1 mL Place 1 spray into the nose once. 12/30/20   [provider]  ofloxacin (OCUFLOX) 0.3 % ophthalmic solution Place 1 drop into both eyes 4 (four) times daily. 02/10/22   [provider]  Omega-3 1000 MG CAPS Take 1,000 mg by mouth daily.    [provider]  omeprazole (PRILOSEC) 20 MG capsule Take 20 mg by mouth daily.    [provider]  rivaroxaban  (XARELTO ) 20 MG TABS tablet Take 20 mg by mouth daily with breakfast. 09/29/19   [provider]  rosuvastatin (CRESTOR) 5 MG tablet Take 5 mg by mouth daily. 10/31/21 02/10/23  [provider]  sertraline  (ZOLOFT ) 50 MG tablet Take 150 mg by mouth daily.     [provider]  traZODone  (DESYREL ) 150 MG tablet Take 150 mg by mouth at bedtime.    [provider]    Physical Exam: Vitals:   12/27/23 0230 12/27/23 0257 12/27/23 0329 12/27/23 0330  BP: 133/68  130/67 133/64  Pulse: (!) 128  94 94  Resp: (!) 22   20  Temp:  (!) 101 F (38.3 C)    TempSrc:  Rectal    SpO2: 94%   97%  Weight:      Height:       Physical Exam Vitals and nursing note reviewed.  Constitutional:      General: She is not in acute distress. HENT:     Head: Normocephalic and atraumatic.  Cardiovascular:     Rate and Rhythm: Regular rhythm. Tachycardia present.     Heart  sounds: Normal heart sounds.  Pulmonary:     Effort: Tachypnea present.     Breath sounds: Normal breath sounds.  Abdominal:     Palpations: Abdomen is soft.     Tenderness: There is no abdominal tenderness.  Neurological:     Mental Status: Mental status is at baseline.     Labs on Admission: I have personally reviewed following labs and imaging studies  CBC: Recent Labs  Lab 12/27/23 0217  WBC 13.7*  NEUTROABS 12.2*  HGB 13.5  HCT 39.2  MCV 85.6  PLT 190   Basic Metabolic Panel: Recent Labs  Lab 12/27/23 0217  NA 134*  K 3.6  CL 99  CO2 22  GLUCOSE 180*  BUN 8  CREATININE 0.71  CALCIUM 9.3  MG 1.6*   GFR: Estimated Creatinine Clearance: 70.4 mL/min (by C-G formula based on SCr of 0.71 mg/dL). Liver Function Tests: Recent Labs  Lab 12/27/23 0217  AST 18  ALT 13  ALKPHOS 44  BILITOT 0.6  PROT 7.3  ALBUMIN 3.7   No results for input(s): LIPASE, AMYLASE in the last 168 hours. No results for input(s): AMMONIA in the last 168 hours. Coagulation Profile: No results for input(s): INR, PROTIME in the last 168 hours. Cardiac Enzymes: No results for input(s): CKTOTAL, CKMB, CKMBINDEX, TROPONINI in the last 168 hours. BNP (last 3 results) No results for input(s): PROBNP in the last 8760 hours. HbA1C: No results for input(s): HGBA1C in the last 72 hours. CBG: No results for input(s): GLUCAP in the last 168 hours. Lipid Profile: No results for input(s): CHOL, HDL, LDLCALC, TRIG, CHOLHDL, LDLDIRECT in the last 72 hours. Thyroid  Function Tests: Recent Labs    12/27/23 0217  TSH 2.054  FREET4 1.41*   Anemia Panel: No results for input(s): VITAMINB12, FOLATE, FERRITIN, TIBC, IRON, RETICCTPCT in the last 72 hours. Urine analysis:    Component Value Date/Time   COLORURINE YELLOW (A) 12/27/2023 0247   APPEARANCEUR CLOUDY (A) 12/27/2023 0247   LABSPEC 1.011 12/27/2023 0247   PHURINE 7.0 12/27/2023 0247    GLUCOSEU 50 (A) 12/27/2023 0247   HGBUR SMALL (A) 12/27/2023 0247   BILIRUBINUR NEGATIVE 12/27/2023 0247   KETONESUR NEGATIVE 12/27/2023 0247   PROTEINUR 30 (A) 12/27/2023 0247   NITRITE POSITIVE (A) 12/27/2023 0247   LEUKOCYTESUR LARGE (A) 12/27/2023 0247    Radiological Exams on Admission: CT Head Wo Contrast Result Date: 12/27/2023 EXAM: CT HEAD AND CERVICAL SPINE 12/27/2023 03:09:25 AM TECHNIQUE: CT of the head and cervical spine was performed without the administration of intravenous contrast. Multiplanar reformatted images are provided for review. Automated exposure control, iterative reconstruction, and/or weight based adjustment of the mA/kV was utilized to reduce the radiation dose to as low as reasonably achievable. COMPARISON: None available. CLINICAL HISTORY: Head trauma, minor (Age >= 65y). Patient fell due to dizziness, denying hitting head or pain. EMS reported emesis en route, patient is alert and oriented x3 but not to time. Family reports early onset dementia. Patient endorses burning while urinating. EMS reported Right Bundle Branch Block with possible atrial flutter on 12-lead ECG. Patient is on Xarelto  at home. FINDINGS: CT HEAD BRAIN AND VENTRICLES: No acute intracranial hemorrhage, mass effect, midline shift, or abnormal extra-axial fluid collection. No evidence of acute infarct. No hydrocephalus. Old left basal ganglia small vessel infarct and chronic ischemic white matter changes are present. ORBITS: No acute abnormality. SINUSES AND MASTOIDS: No acute abnormality. SOFT TISSUES AND SKULL: No acute skull fracture. No acute soft tissue abnormality. CT CERVICAL SPINE BONES AND ALIGNMENT: No acute fracture or traumatic malalignment. DEGENERATIVE CHANGES: No significant degenerative changes. SOFT TISSUES: No prevertebral soft tissue swelling. IMPRESSION: 1. No acute intracranial abnormality. 2. Chronic findings: old left basal ganglia small-vessel infarct and chronic ischemic white  matter changes. 3. No acute abnormality of the cervical spine. Electronically signed by: Franky Stanford MD 12/27/2023 03:20 AM EDT RP Workstation: [id number]   CT Cervical Spine Wo Contrast Result Date: 12/27/2023 EXAM: CT HEAD AND CERVICAL SPINE 12/27/2023 03:09:25 AM TECHNIQUE: CT of the head and cervical spine was performed without the administration of intravenous contrast. Multiplanar reformatted images are provided for review. Automated exposure control, iterative reconstruction, and/or weight based adjustment of the mA/kV was utilized to reduce the radiation dose to as low as  reasonably achievable. COMPARISON: None available. CLINICAL HISTORY: Head trauma, minor (Age >= 65y). Patient fell due to dizziness, denying hitting head or pain. EMS reported emesis en route, patient is alert and oriented x3 but not to time. Family reports early onset dementia. Patient endorses burning while urinating. EMS reported Right Bundle Branch Block with possible atrial flutter on 12-lead ECG. Patient is on Xarelto  at home. FINDINGS: CT HEAD BRAIN AND VENTRICLES: No acute intracranial hemorrhage, mass effect, midline shift, or abnormal extra-axial fluid collection. No evidence of acute infarct. No hydrocephalus. Old left basal ganglia small vessel infarct and chronic ischemic white matter changes are present. ORBITS: No acute abnormality. SINUSES AND MASTOIDS: No acute abnormality. SOFT TISSUES AND SKULL: No acute skull fracture. No acute soft tissue abnormality. CT CERVICAL SPINE BONES AND ALIGNMENT: No acute fracture or traumatic malalignment. DEGENERATIVE CHANGES: No significant degenerative changes. SOFT TISSUES: No prevertebral soft tissue swelling. IMPRESSION: 1. No acute intracranial abnormality. 2. 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 3. No acute abnormality of the cervical spine. Electronically signed by: Franky Stanford MD 12/27/2023 03:20 AM EDT RP  Workstation: [id number]   DG Pelvis 1-2 Views Result Date: 12/27/2023 EXAM: 1 or 2 VIEW(S) XRAY OF THE PELVIS 12/27/2023 02:39:00 AM COMPARISON: None available. CLINICAL HISTORY: fall on buttock. Pt fell on floor beside her bed. Reports she fell d/t dizziness. Denies hitting head. Denies pain. EMS reports pt family report pt has early onset dementia. Pt is on Xarelto  at home. FINDINGS: BONES AND JOINTS: Partially visualized left femoral intramedullary rod with interlocking screw. Old fracture deformity of the proximal left femur. Mild degenerative changes in bilateral hips. No acute fracture. No joint dislocation. SOFT TISSUES: The soft tissues are unremarkable. IMPRESSION: 1. No acute fracture or dislocation. 2. Old fracture deformity of the proximal left femur with partially visualized left femoral intramedullary rod and interlocking screw. 3. Mild degenerative changes in bilateral hips. Electronically signed by: Franky Stanford MD 12/27/2023 03:08 AM EDT RP Workstation: [id number]   DG Chest Port 1 View Result Date: 12/27/2023 EXAM: 1 VIEW(S) XRAY OF THE CHEST 12/27/2023 02:39:00 AM COMPARISON: 03/01/2022. CLINICAL HISTORY: cp. Pt arrived via EMS from home. Pt fell on floor beside her bed. Reports she fell d/t dizziness. Denies hitting head. Denies pain. EMS reported episode of emesis on the way here. A/O x3, not oriented to time. EMS reports pt family report pt has early onset dementia. Pt endorses burning while urinating. EMS reports their 12-lead ECG showed Right Bundle Branch Block w/possible A flutter. Pt is on Xarelto  at home. FINDINGS: LUNGS AND PLEURA: Low lung volume. Hazy opacity overlying left lateral costophrenic angle, likely atelectasis. No pulmonary edema. No pleural effusion. No pneumothorax. HEART AND MEDIASTINUM: Aortic arch calcifications. No acute abnormality of the cardiac and mediastinal silhouettes. BONES AND SOFT TISSUES: Right axillary surgical clips noted. Bilateral breast implants  noted. No acute osseous abnormality. IMPRESSION: 1. Low lung volumes with likely atelectasis at the left lateral costophrenic angle; no acute cardiopulmonary process. 2. Aortic arch atherosclerotic calcifications. 3. Right axillary surgical clips and bilateral breast implants. Electronically signed by: Franky Stanford MD 12/27/2023 03:07 AM EDT RP Workstation: [id number]   Data Reviewed for HPI: Relevant notes from primary care and specialist visits, past discharge summaries as available in EHR, including Care Everywhere. Prior diagnostic testing as pertinent to current admission diagnoses Updated medications and problem lists for reconciliation ED course, including vitals, labs, imaging, treatment and response to treatment Triage notes, nursing  and pharmacy notes and ED provider's notes Notable results as noted above in HPI      Assessment and Plan: * Sepsis secondary to UTI (HCC) Sepsis criteria include fever, tachycardia with leukocytosis and lactic acidosis Continue sepsis fluids Rocephin Follow cultures  Fall at home, initial encounter Trauma imaging negative for acute injury Fall precautions  Paroxysmal atrial flutter (HCC) Chronic anticoagulation Continue Xarelto , continue metoprolol  No evidence of injury from fall  Dementia without behavioral disturbance (HCC) Delirium precautions Resume home meds pending med rec  Essential hypertension Normotensive Will hold home antihypertensives in the setting of sepsis  Diabetes mellitus (HCC) Sliding scale insulin  coverage     DVT prophylaxis: xarelto   Consults: none  Advance Care Planning:   Code Status: Prior   Family Communication: none  Disposition Plan: Back to previous home environment  Severity of Illness: The appropriate patient status for this patient is OBSERVATION. Observation status is judged to be reasonable and necessary in order to provide the required intensity of service to ensure the patient's safety.  The patient's presenting symptoms, physical exam findings, and initial radiographic and laboratory data in the context of their medical condition is felt to place them at decreased risk for further clinical deterioration. Furthermore, it is anticipated that the patient will be medically stable for discharge from the hospital within 2 midnights of admission.   Author: Delayne LULLA Solian, MD 12/27/2023 4:07 AM  For on call review [email or web address].

## 2023-12-27 NOTE — ED Notes (Signed)
 PT used bed pan to void. No other needs at this time. Call bell within reach.

## 2023-12-27 NOTE — ED Provider Notes (Signed)
 [facility] Provider Note    Event Date/Time   First MD Initiated Contact with Patient 12/27/23 0203     (approximate)   History   Fall   HPI  Melanie Hammond is a 73 y.o. female   Past medical history of hypertension diabetes.  Here with confusion and dysuria.  Also had a fall when she fell backwards trying to sit on a cushion on the recliner and missed.  Fell onto her buttocks.  No head strike.  No loss of consciousness.  Daughter lives with mother.  Daughter notes that the mother has been more confused starting last evening.  She has had urine infections in the past.  Patient denies any acute pain or any other acute medical complaints at this time.  She is answering my questions appropriately.  Independent Historian contributed to assessment above: Daughter at bedside   Physical Exam   Triage Vital Signs: ED Triage Vitals  Encounter Vitals Group     BP      Girls Systolic BP Percentile      Girls Diastolic BP Percentile      Boys Systolic BP Percentile      Boys Diastolic BP Percentile      Pulse      Resp      Temp      Temp src      SpO2      Weight      Height      Head Circumference      Peak Flow      Pain Score      Pain Loc      Pain Education      Exclude from Growth Chart     Most recent vital signs: Vitals:   12/27/23 0421 12/27/23 0500  BP:  (!) 146/67  Pulse:  82  Resp:  18  Temp: 99.4 F (37.4 C) 97.8 F (36.6 C)  SpO2:  95%    General: Awake, no distress.  CV:  Good peripheral perfusion.  Resp:  Normal effort.  Abd:  No distention.  Other:  Pleasant woman in no acute distress.  Febrile and tachycardic.  Soft benign abdominal exam to palpation all quadrants.  No CVA tenderness.  Clear lungs to auscultation.   ED Results / Procedures / Treatments   Labs (all labs ordered are listed, but only abnormal results are displayed) Labs Reviewed  COMPREHENSIVE METABOLIC PANEL WITH GFR - Abnormal; Notable for the  following components:      Result Value   Sodium 134 (*)    Glucose, Bld 180 (*)    All other components within normal limits  CBC WITH DIFFERENTIAL/PLATELET - Abnormal; Notable for the following components:   WBC 13.7 (*)    Neutro Abs 12.2 (*)    Lymphs Abs 0.6 (*)    All other components within normal limits  T4, FREE - Abnormal; Notable for the following components:   Free T4 1.41 (*)    All other components within normal limits  MAGNESIUM  - Abnormal; Notable for the following components:   Magnesium  1.6 (*)    All other components within normal limits  URINALYSIS, W/ REFLEX TO CULTURE (INFECTION SUSPECTED) - Abnormal; Notable for the following components:   Color, Urine YELLOW (*)    APPearance CLOUDY (*)    Glucose, UA 50 (*)    Hgb urine dipstick SMALL (*)    Protein, ur 30 (*)    Nitrite POSITIVE (*)  Leukocytes,Ua LARGE (*)    Bacteria, UA RARE (*)    All other components within normal limits  URINE DRUG SCREEN, QUALITATIVE ([facility] ONLY) - Abnormal; Notable for the following components:   Tricyclic, Ur Screen POSITIVE (*)    All other components within normal limits  LACTIC ACID, PLASMA - Abnormal; Notable for the following components:   Lactic Acid, Venous 2.4 (*)    All other components within normal limits  PROTIME-INR - Abnormal; Notable for the following components:   Prothrombin Time 16.0 (*)    All other components within normal limits  CULTURE, BLOOD (ROUTINE X 2)  CULTURE, BLOOD (ROUTINE X 2)  URINE CULTURE  TSH  ETHANOL  LACTIC ACID, PLASMA  HEMOGLOBIN A1C  CORTISOL-AM, BLOOD  TROPONIN I (HIGH SENSITIVITY)  TROPONIN I (HIGH SENSITIVITY)     I ordered and reviewed the above labs they are notable for infected urine, lactic acidosis, leukocytosis  EKG  ED ECG REPORT I, Ginnie Shams, the attending physician, personally viewed and interpreted this ECG.   Date: 12/27/2023  EKG Time: 0157  Rate: 133  Rhythm: sinus tachycardia  Axis: nl  Intervals:  Nonspecific intraventricular conduction delay, right bundle branch block and LPFB with similar morphology to prior EKG  ST&T Change: No STEMI    RADIOLOGY I independently reviewed and interpreted CT head see no obvious bleeding or midline shift I also reviewed radiologist's formal read.   PROCEDURES:  Critical Care performed: Yes, see critical care procedure note(s)  .Critical Care  Performed by: Shams Ginnie, MD Authorized by: Shams Ginnie, MD   Critical care provider statement:    Critical care time (minutes):  40   Critical care was time spent personally by me on the following activities:  Development of treatment plan with patient or surrogate, discussions with consultants, evaluation of patient's response to treatment, examination of patient, ordering and review of laboratory studies, ordering and review of radiographic studies, ordering and performing treatments and interventions, pulse oximetry, re-evaluation of patient's condition and review of old charts    MEDICATIONS ORDERED IN ED: Medications  rivaroxaban  (XARELTO ) tablet 20 mg (has no administration in time range)  lactated ringers  infusion ( Intravenous Infusion Verify 12/27/23 0656)  insulin  aspart (novoLOG ) injection 0-15 Units (has no administration in time range)  insulin  aspart (novoLOG ) injection 0-5 Units (has no administration in time range)  cefTRIAXone (ROCEPHIN) 1 g in sodium chloride  0.9 % 100 mL IVPB (has no administration in time range)  acetaminophen  (TYLENOL ) tablet 650 mg (has no administration in time range)    Or  acetaminophen  (TYLENOL ) suppository 650 mg (has no administration in time range)  HYDROcodone -acetaminophen  (NORCO/VICODIN) 5-325 MG per tablet 1-2 tablet (has no administration in time range)  ondansetron  (ZOFRAN ) tablet 4 mg (has no administration in time range)    Or  ondansetron  (ZOFRAN ) injection 4 mg (has no administration in time range)  albuterol  (PROVENTIL ) (2.5 MG/3ML) 0.083%  nebulizer solution 2.5 mg (has no administration in time range)  sodium chloride  0.9 % bolus 1,000 mL (0 mLs Intravenous Stopped 12/27/23 0333)  acetaminophen  (TYLENOL ) tablet 650 mg (650 mg Oral Given 12/27/23 0238)  sodium chloride  0.9 % bolus 1,000 mL (0 mLs Intravenous Stopped 12/27/23 0333)  cefTRIAXone (ROCEPHIN) 1 g in sodium chloride  0.9 % 100 mL IVPB (0 g Intravenous Stopped 12/27/23 0333)  magnesium  sulfate IVPB 2 g 50 mL (0 g Intravenous Stopped 12/27/23 0425)  carvedilol (COREG) tablet 25 mg (25 mg Oral Given 12/27/23 0329)  External physician / consultants:  I spoke with hospital medicine for admission at regarding care plan for this patient.   IMPRESSION / MDM / ASSESSMENT AND PLAN / ED COURSE  I reviewed the triage vital signs and the nursing notes.                                Patient's presentation is most consistent with acute presentation with potential threat to life or bodily function.  Differential diagnosis includes, but is not limited to, urine infection, sepsis, trauma including pelvic fracture dislocation, head injury   The patient is on the cardiac monitor to evaluate for evidence of arrhythmia and/or significant heart rate changes.  MDM:    Patient with confusion and dysuria found to have a urine infection.  Meets severe sepsis criteria with increased lactic, leukocytosis, tachycardia.  Given sepsis bolus of fluids, Rocephin, and admission to hospital service.  With a history of fall, traumatic imaging obtained including CT head and neck as well as x-ray of the pelvis fortunately looks negative.  Admission.       FINAL CLINICAL IMPRESSION(S) / ED DIAGNOSES   Final diagnoses:  Fall, initial encounter  Sepsis, due to unspecified organism, unspecified whether acute organ dysfunction present (HCC)  Acute lower urinary tract infection  Altered mental status, unspecified altered mental status type     Rx / DC Orders   ED Discharge Orders     None         Note:  This document was prepared using Dragon voice recognition software and may include unintentional dictation errors.    Cyrena Mylar, MD 12/27/23 [phone]

## 2023-12-27 NOTE — Consult Note (Signed)
 PHARMACY - PHYSICIAN COMMUNICATION CRITICAL VALUE ALERT - BLOOD CULTURE IDENTIFICATION (BCID)  BCID= E.coli 1/4 sets, no resistance detected   Name of physician (or Provider) Contacted: Dezii, Alexandra  Changes to prescribed antibiotics required: none   Melanie Hammond 12/27/2023  6:52 PM

## 2023-12-27 NOTE — Plan of Care (Signed)

## 2023-12-27 NOTE — Assessment & Plan Note (Signed)
 Trauma imaging negative for acute injury Fall precautions

## 2023-12-27 NOTE — Assessment & Plan Note (Signed)
 Restart Coreg, continue Norvasc  and Avapro 

## 2023-12-27 NOTE — Assessment & Plan Note (Signed)
 Sliding scale insulin  coverage

## 2023-12-27 NOTE — Plan of Care (Signed)
  Problem: Pain Managment: Goal: General experience of comfort will improve and/or be controlled Outcome: Progressing

## 2023-12-28 DIAGNOSIS — I480 Paroxysmal atrial fibrillation: Secondary | ICD-10-CM | POA: Diagnosis present

## 2023-12-28 DIAGNOSIS — Z6826 Body mass index (BMI) 26.0-26.9, adult: Secondary | ICD-10-CM | POA: Diagnosis not present

## 2023-12-28 DIAGNOSIS — Z7984 Long term (current) use of oral hypoglycemic drugs: Secondary | ICD-10-CM | POA: Diagnosis not present

## 2023-12-28 DIAGNOSIS — F17210 Nicotine dependence, cigarettes, uncomplicated: Secondary | ICD-10-CM | POA: Diagnosis present

## 2023-12-28 DIAGNOSIS — Z8249 Family history of ischemic heart disease and other diseases of the circulatory system: Secondary | ICD-10-CM | POA: Diagnosis not present

## 2023-12-28 DIAGNOSIS — Z1152 Encounter for screening for COVID-19: Secondary | ICD-10-CM | POA: Diagnosis not present

## 2023-12-28 DIAGNOSIS — E876 Hypokalemia: Secondary | ICD-10-CM | POA: Diagnosis not present

## 2023-12-28 DIAGNOSIS — Z79890 Hormone replacement therapy (postmenopausal): Secondary | ICD-10-CM | POA: Diagnosis not present

## 2023-12-28 DIAGNOSIS — Z7901 Long term (current) use of anticoagulants: Secondary | ICD-10-CM | POA: Diagnosis not present

## 2023-12-28 DIAGNOSIS — N390 Urinary tract infection, site not specified: Secondary | ICD-10-CM | POA: Diagnosis present

## 2023-12-28 DIAGNOSIS — I4892 Unspecified atrial flutter: Secondary | ICD-10-CM | POA: Diagnosis present

## 2023-12-28 DIAGNOSIS — D696 Thrombocytopenia, unspecified: Secondary | ICD-10-CM | POA: Diagnosis present

## 2023-12-28 DIAGNOSIS — A4151 Sepsis due to Escherichia coli [E. coli]: Secondary | ICD-10-CM | POA: Diagnosis present

## 2023-12-28 DIAGNOSIS — A419 Sepsis, unspecified organism: Secondary | ICD-10-CM | POA: Diagnosis present

## 2023-12-28 DIAGNOSIS — E872 Acidosis, unspecified: Secondary | ICD-10-CM | POA: Diagnosis present

## 2023-12-28 DIAGNOSIS — Y92009 Unspecified place in unspecified non-institutional (private) residence as the place of occurrence of the external cause: Secondary | ICD-10-CM | POA: Diagnosis not present

## 2023-12-28 DIAGNOSIS — Z7982 Long term (current) use of aspirin: Secondary | ICD-10-CM | POA: Diagnosis not present

## 2023-12-28 DIAGNOSIS — F0390 Unspecified dementia without behavioral disturbance: Secondary | ICD-10-CM | POA: Diagnosis present

## 2023-12-28 DIAGNOSIS — E119 Type 2 diabetes mellitus without complications: Secondary | ICD-10-CM | POA: Diagnosis present

## 2023-12-28 DIAGNOSIS — W19XXXA Unspecified fall, initial encounter: Secondary | ICD-10-CM | POA: Diagnosis present

## 2023-12-28 DIAGNOSIS — I10 Essential (primary) hypertension: Secondary | ICD-10-CM | POA: Diagnosis present

## 2023-12-28 DIAGNOSIS — Z79899 Other long term (current) drug therapy: Secondary | ICD-10-CM | POA: Diagnosis not present

## 2023-12-28 DIAGNOSIS — E663 Overweight: Secondary | ICD-10-CM | POA: Diagnosis present

## 2023-12-28 DIAGNOSIS — R6520 Severe sepsis without septic shock: Secondary | ICD-10-CM | POA: Diagnosis present

## 2023-12-28 LAB — COMPREHENSIVE METABOLIC PANEL WITH GFR
ALT: 9 U/L (ref 0–44)
AST: 10 U/L — ABNORMAL LOW (ref 15–41)
Albumin: 2.8 g/dL — ABNORMAL LOW (ref 3.5–5.0)
Alkaline Phosphatase: 30 U/L — ABNORMAL LOW (ref 38–126)
Anion gap: 11 (ref 5–15)
BUN: 9 mg/dL (ref 8–23)
CO2: 23 mmol/L (ref 22–32)
Calcium: 8.7 mg/dL — ABNORMAL LOW (ref 8.9–10.3)
Chloride: 102 mmol/L (ref 98–111)
Creatinine, Ser: 0.73 mg/dL (ref 0.44–1.00)
GFR, Estimated: >60 mL/min (ref >60)
Glucose, Bld: 131 mg/dL — ABNORMAL HIGH (ref 70–99)
Potassium: 3.4 mmol/L — ABNORMAL LOW (ref 3.5–5.1)
Sodium: 136 mmol/L (ref 135–145)
Total Bilirubin: 0.3 mg/dL (ref 0.0–1.2)
Total Protein: 5.9 g/dL — ABNORMAL LOW (ref 6.5–8.1)

## 2023-12-28 LAB — CBC WITH DIFFERENTIAL/PLATELET
Abs Immature Granulocytes: 0.05 K/uL (ref 0.00–0.07)
Basophils Absolute: 0 K/uL (ref 0.0–0.1)
Basophils Relative: 0 %
Eosinophils Absolute: 0 K/uL (ref 0.0–0.5)
Eosinophils Relative: 1 %
HCT: 29.5 % — ABNORMAL LOW (ref 36.0–46.0)
Hemoglobin: 9.7 g/dL — ABNORMAL LOW (ref 12.0–15.0)
Immature Granulocytes: 1 %
Lymphocytes Relative: 11 %
Lymphs Abs: 0.6 K/uL — ABNORMAL LOW (ref 0.7–4.0)
MCH: 28.8 pg (ref 26.0–34.0)
MCHC: 32.9 g/dL (ref 30.0–36.0)
MCV: 87.5 fL (ref 80.0–100.0)
Monocytes Absolute: 0.5 K/uL (ref 0.1–1.0)
Monocytes Relative: 9 %
Neutro Abs: 4.5 K/uL (ref 1.7–7.7)
Neutrophils Relative %: 78 %
Platelets: 109 K/uL — ABNORMAL LOW (ref 150–400)
RBC: 3.37 MIL/uL — ABNORMAL LOW (ref 3.87–5.11)
RDW: 15.7 % — ABNORMAL HIGH (ref 11.5–15.5)
WBC: 5.7 K/uL (ref 4.0–10.5)
nRBC: 0 % (ref 0.0–0.2)

## 2023-12-28 LAB — PHOSPHORUS: Phosphorus: 2 mg/dL — ABNORMAL LOW (ref 2.5–4.6)

## 2023-12-28 LAB — GLUCOSE, CAPILLARY
Glucose-Capillary: 121 mg/dL — ABNORMAL HIGH (ref 70–99)
Glucose-Capillary: 125 mg/dL — ABNORMAL HIGH (ref 70–99)
Glucose-Capillary: 126 mg/dL — ABNORMAL HIGH (ref 70–99)

## 2023-12-28 LAB — MAGNESIUM: Magnesium: 2 mg/dL (ref 1.7–2.4)

## 2023-12-28 MED ORDER — SODIUM CHLORIDE 0.9 % IV SOLN
1.0000 g | Freq: Once | INTRAVENOUS | Status: AC
  Administered 2023-12-28: 1 g via INTRAVENOUS
  Filled 2023-12-28: qty 10

## 2023-12-28 MED ORDER — POTASSIUM PHOSPHATES 15 MMOLE/5ML IV SOLN
15.0000 mmol | Freq: Once | INTRAVENOUS | Status: AC
  Administered 2023-12-28: 15 mmol via INTRAVENOUS
  Filled 2023-12-28: qty 5

## 2023-12-28 MED ORDER — SODIUM CHLORIDE 0.9 % IV SOLN
2.0000 g | INTRAVENOUS | Status: DC
  Administered 2023-12-29 – 2023-12-30 (×2): 2 g via INTRAVENOUS
  Filled 2023-12-28 (×2): qty 20

## 2023-12-28 NOTE — Evaluation (Signed)
 Physical Therapy Evaluation Patient Details Name: Melanie Hammond MRN: [id number] DOB: 1950-08-04 Today's Date: 12/28/2023  History of Present Illness  Pt is a 73 y.o. female admitted with sepsis secondary to UTI with fall 2/2 dizziness. PMH significant for DM, HTN, A-flutter, dementia  Clinical Impression  Patient received in bed, covered in coffee. She is agreeable to PT assessment. Patient is mod I for bed mobility and sit to stand. She ambulated with RW and cga 25 feet. BP measured during session and was negative for orthostatic hypotension.  Patient is incontinent of urine and required clean up and gown change during session. She will continue to benefit from skilled PT to improve safety with mobility.         If plan is discharge home, recommend the following: A little help with walking and/or transfers;A little help with bathing/dressing/bathroom   Can travel by private vehicle    yes    Equipment Recommendations None recommended by PT  Recommendations for Other Services       Functional Status Assessment Patient has had a recent decline in their functional status and demonstrates the ability to make significant improvements in function in a reasonable and predictable amount of time.     Precautions / Restrictions Precautions Precautions: Fall Restrictions Weight Bearing Restrictions Per Provider Order: No      Mobility  Bed Mobility Overal bed mobility: Modified Independent                  Transfers Overall transfer level: Needs assistance Equipment used: Rolling walker (2 wheels) Transfers: Sit to/from Stand Sit to Stand: Supervision                Ambulation/Gait Ambulation/Gait assistance: Contact guard assist Gait Distance (Feet): 25 Feet Assistive device: Rolling walker (2 wheels) Gait Pattern/deviations: Step-through pattern, Decreased step length - right, Decreased step length - left, Decreased stride length Gait velocity: decr         Stairs            Wheelchair Mobility     Tilt Bed    Modified Rankin (Stroke Patients Only)       Balance Overall balance assessment: Needs assistance Sitting-balance support: Feet supported Sitting balance-Leahy Scale: Good     Standing balance support: Bilateral upper extremity supported, During functional activity, Reliant on assistive device for balance Standing balance-Leahy Scale: Good                               Pertinent Vitals/Pain Pain Assessment Pain Assessment: No/denies pain    Home Living Family/patient expects to be discharged to:: Private residence Living Arrangements: Children Available Help at Discharge: Family;Available 24 hours/day Type of Home: House Home Access: Stairs to enter   [employer] of Steps: 6   Home Layout: One level Home Equipment: Agricultural consultant (2 wheels);Rollator (4 wheels);Wheelchair - manual;Shower seat      Prior Function Prior Level of Function : Needs assist       Physical Assist : Mobility (physical) Mobility (physical): Gait   Mobility Comments: rollator in home or wheelchair for community distances ADLs Comments: independent     Extremity/Trunk Assessment   Upper Extremity Assessment Upper Extremity Assessment: Defer to OT evaluation    Lower Extremity Assessment Lower Extremity Assessment: Generalized weakness    Cervical / Trunk Assessment Cervical / Trunk Assessment: Normal  Communication   Communication Communication: No apparent difficulties    Cognition Arousal:  Alert Behavior During Therapy: WFL for tasks assessed/performed   PT - Cognitive impairments: History of cognitive impairments                         Following commands: Impaired Following commands impaired: Follows one step commands inconsistently, Follows one step commands with increased time     Cueing Cueing Techniques: Verbal cues, Gestural cues     General Comments       Exercises     Assessment/Plan    PT Assessment Patient needs continued PT services  PT Problem List Decreased strength;Decreased activity tolerance;Decreased balance;Decreased mobility;Decreased safety awareness;Decreased cognition       PT Treatment Interventions DME instruction;Gait training;Stair training;Functional mobility training;Therapeutic activities;Therapeutic exercise;Balance training;Patient/family education    PT Goals (Current goals can be found in the Care Plan section)  Acute Rehab PT Goals Patient Stated Goal: return home PT Goal Formulation: With patient Time For Goal Achievement: 01/11/24 Potential to Achieve Goals: Good    Frequency Min 2X/week     Co-evaluation PT/OT/SLP Co-Evaluation/Treatment: Yes Reason for Co-Treatment: For patient/therapist safety;Necessary to address cognition/behavior during functional activity;To address functional/ADL transfers PT goals addressed during session: Mobility/safety with mobility;Balance;Proper use of DME         AM-PAC PT 6 Clicks Mobility  Outcome Measure Help needed turning from your back to your side while in a flat bed without using bedrails?: A Little Help needed moving from lying on your back to sitting on the side of a flat bed without using bedrails?: A Little Help needed moving to and from a bed to a chair (including a wheelchair)?: A Little Help needed standing up from a chair using your arms (e.g., wheelchair or bedside chair)?: A Little Help needed to walk in hospital room?: A Little Help needed climbing 3-5 steps with a railing? : A Little 6 Click Score: 18    End of Session   Activity Tolerance: Patient tolerated treatment well Patient left: in chair;with call bell/phone within reach;with chair alarm set Nurse Communication: Mobility status PT Visit Diagnosis: Muscle weakness (generalized) (M62.81);Other abnormalities of gait and mobility (R26.89);History of falling (Z91.81);Difficulty in  walking, not elsewhere classified (R26.2)    Time: 8985-8963 PT Time Calculation (min) (ACUTE ONLY): 22 min   Charges:   PT Evaluation $PT Eval Low Complexity: 1 Low   PT General Charges $$ ACUTE PT VISIT: 1 Visit         Kimara Bencomo, PT, GCS 12/28/23,10:48 AM

## 2023-12-28 NOTE — Progress Notes (Signed)
 PROGRESS NOTE    Melanie Hammond  FMW:[id number] DOB: 01-10-1951 DOA: 12/27/2023 PCP: Rea Myna Barrows, MD  Chief Complaint  Patient presents with   [facility] Course:  Lesle Hammond is a 73 year old female with diabetes, hypertension, a flutter on anticoagulation, dementia, polypharmacy, who presents with sepsis secondary to UTI.  She also had a fall which was preceded by dizziness.  On arrival to the ED she was febrile to 101, heart rate 135, leukocytosis of 13, lactic acid 2.4.  CT head and C-spine nontraumatic.  She was darted on Rocephin.  On 10/6 blood cultures resulted positive for [name].  Subjective: No acute events overnight.  Patient is alert and oriented x 4 today. She reports feeling much better than yesterday.  Objective: Vitals:   12/27/23 1937 12/28/23 0428 12/28/23 0446 12/28/23 0731  BP: (!) 158/64 (!) 168/57 (!) 181/58 (!) 168/65  Pulse: 77 85 85 85  Resp: 17 15 16 16   Temp: 99.3 F (37.4 C) 100.1 F (37.8 C)  (!) 100.6 F (38.1 C)  TempSrc:    Oral  SpO2: 95% 93% 92% 92%  Weight:      Height:        Intake/Output Summary (Last 24 hours) at 12/28/2023 1623 Last data filed at 12/28/2023 1524 Gross per 24 hour  Intake 640.38 ml  Output --  Net 640.38 ml   Filed Weights   12/27/23 0208  Weight: 79.7 kg    Examination: General exam: Appears calm and comfortable, NAD  Respiratory system: No work of breathing, symmetric chest wall expansion Cardiovascular system: S1 & S2 heard, RRR.  Gastrointestinal system: Abdomen is nondistended, soft and nontender.  Neuro: Alert and oriented. No focal neurological deficits. Extremities: Symmetric, expected ROM Skin: No rashes, lesions Psychiatry: Mood & affect appropriate for situation.   Assessment & Plan:  Principal Problem:   Sepsis secondary to UTI [facility]) Active Problems:   Fall at home, initial encounter   Paroxysmal atrial flutter (HCC)   Diabetes mellitus (HCC)   Essential hypertension   Dementia  without behavioral disturbance (HCC)   Primary hypertension   Chronic anticoagulation    Sepsis [name] UTI [name] bacteremia - Currently on ceftriaxone - Continue with current therapy until sensitivities have resulted.  Evaluate at that time to see if there are p.o. options  Dementia without behavioral disturbance acute metabolic encephalopathy - Patient was disoriented will be at baseline yesterday, likely secondary to sepsis - As infection resolves patient is nearing her baseline - Daughter reports at baseline patient is independent of all ADLs but has occasional forgetful episodes regarding things like medication management.  Polypharmacy - Patient is actively taking amitriptyline , Wellbutrin , BuSpar , Soma , trazodone , mirtazapine, Zoloft . - I had extensive discussion with the patient as well as with her daughter that this degree of psychiatric medications is certainly confusing her dementia diagnosis and she is high risk for things such as serotonin syndrome, and CNS depression. They both endorse understanding and are open to de-escalation of these medications - Have started with gradual dose lowering for now.  Have discontinued trazodone  at bedtime - Recommend providing patient a list of gradual de-escalation for discharge.  She will need to follow-up closely with her primary care to complete this process  Hypertension - At home patient is on carvedilol, amlodipine , hydralazine , irbesartan , metoprolol ,.  There redundancies in therapy - Have discussed directly with the patient that she does not currently require all of these medications. - Gradually resume medications as  tolerated.  Recommend deprescribing at discharge.  Paroxysmal A-fib Chronic anticoagulation - Continue Xarelto  and home meds  Diabetes - Hemoglobin A1c 5.7%, no indication for insulin  at this time.  DVT prophylaxis: Xarelto    Code Status: Full Code Disposition:  Inpatient, pending final antibiotic plan.   Spoke with daughter directly on the phone 10/6.  Attempted to call today, no answer x 2.  Consultants:    Procedures:    Antimicrobials:  Anti-infectives (From admission, onward)    Start     Dose/Rate Route Frequency Ordered Stop   12/29/23 1000  cefTRIAXone (ROCEPHIN) 2 g in sodium chloride  0.9 % 100 mL IVPB        2 g 200 mL/hr over 30 Minutes Intravenous Every 24 hours 12/28/23 0854     12/28/23 1000  cefTRIAXone (ROCEPHIN) 1 g in sodium chloride  0.9 % 100 mL IVPB        1 g 200 mL/hr over 30 Minutes Intravenous  Once 12/28/23 0854 12/28/23 1140   12/28/23 0600  cefTRIAXone (ROCEPHIN) 1 g in sodium chloride  0.9 % 100 mL IVPB  Status:  Discontinued        1 g 200 mL/hr over 30 Minutes Intravenous Every 24 hours 12/27/23 0410 12/28/23 0854   12/27/23 0315  cefTRIAXone (ROCEPHIN) 1 g in sodium chloride  0.9 % 100 mL IVPB        1 g 200 mL/hr over 30 Minutes Intravenous  Once 12/27/23 0313 12/27/23 0333       Data Reviewed: I have personally reviewed following labs and imaging studies CBC: Recent Labs  Lab 12/27/23 0217 12/28/23 0533  WBC 13.7* 5.7  NEUTROABS 12.2* 4.5  HGB 13.5 9.7*  HCT 39.2 29.5*  MCV 85.6 87.5  PLT 190 109*   Basic Metabolic Panel: Recent Labs  Lab 12/27/23 0217 12/28/23 0533  NA 134* 136  K 3.6 3.4*  CL 99 102  CO2 22 23  GLUCOSE 180* 131*  BUN 8 9  CREATININE 0.71 0.73  CALCIUM 9.3 8.7*  MG 1.6* 2.0  PHOS  --  2.0*   GFR: Estimated Creatinine Clearance: 70.4 mL/min (by C-G formula based on SCr of 0.73 mg/dL). Liver Function Tests: Recent Labs  Lab 12/27/23 0217 12/28/23 0533  AST 18 10*  ALT 13 9  ALKPHOS 44 30*  BILITOT 0.6 0.3  PROT 7.3 5.9*  ALBUMIN 3.7 2.8*   CBG: Recent Labs  Lab 12/27/23 1248 12/27/23 1715 12/27/23 2120 12/28/23 0759 12/28/23 1149  GLUCAP 158* 119* 133* 121* 125*    Recent Results (from the past 240 hours)  Culture, blood (routine x 2)     Status: Abnormal (Preliminary result)   Collection  Time: 12/27/23  2:10 AM   Specimen: BLOOD  Result Value Ref Range Status   Specimen Description   Final    BLOOD LEFT HAND Performed at [facility], [address], [place], [place] [address]    Special Requests   Final    BOTTLES DRAWN AEROBIC AND ANAEROBIC Blood Culture adequate volume Performed at [facility], [address] [place], [place] [address]    Culture  Setup Time   Final    GRAM NEGATIVE RODS AEROBIC BOTTLE ONLY CRITICAL RESULT CALLED TO, READ BACK BY AND VERIFIED WITH: tiffany gilchrist pharm.d 12/27/23 1712 kg    Culture (A)  Final    ESCHERICHIA COLI CULTURE REINCUBATED FOR BETTER GROWTH Performed at [facility], [address], [place], [place] [address]  Report Status PENDING  Incomplete  Blood Culture ID Panel (Reflexed)     Status: Abnormal   Collection Time: 12/27/23  2:10 AM  Result Value Ref Range Status   Enterococcus faecalis NOT DETECTED NOT DETECTED Final   Enterococcus Faecium NOT DETECTED NOT DETECTED Final   Listeria monocytogenes NOT DETECTED NOT DETECTED Final   Staphylococcus species NOT DETECTED NOT DETECTED Final   Staphylococcus aureus (BCID) NOT DETECTED NOT DETECTED Final   Staphylococcus epidermidis NOT DETECTED NOT DETECTED Final   Staphylococcus lugdunensis NOT DETECTED NOT DETECTED Final   Streptococcus species NOT DETECTED NOT DETECTED Final   Streptococcus agalactiae NOT DETECTED NOT DETECTED Final   Streptococcus pneumoniae NOT DETECTED NOT DETECTED Final   Streptococcus pyogenes NOT DETECTED NOT DETECTED Final   A.calcoaceticus-baumannii NOT DETECTED NOT DETECTED Final   Bacteroides fragilis NOT DETECTED NOT DETECTED Final   Enterobacterales DETECTED (A) NOT DETECTED Final    Comment: Enterobacterales represent a large order of gram negative bacteria, not a single organism. CRITICAL RESULT CALLED TO, READ BACK BY AND VERIFIED WITH: Tiffany Gilchrist PHARM.D 12/27/23 1712    Enterobacter cloacae  complex NOT DETECTED NOT DETECTED Final   Escherichia coli DETECTED (A) NOT DETECTED Final    Comment: CRITICAL RESULT CALLED TO, READ BACK BY AND VERIFIED WITH: Tiffany Gilchrist PHARM.D 12/27/23 1712    Klebsiella aerogenes NOT DETECTED NOT DETECTED Final   Klebsiella oxytoca NOT DETECTED NOT DETECTED Final   Klebsiella pneumoniae NOT DETECTED NOT DETECTED Final   Proteus species NOT DETECTED NOT DETECTED Final   Salmonella species NOT DETECTED NOT DETECTED Final   Serratia marcescens NOT DETECTED NOT DETECTED Final   Haemophilus influenzae NOT DETECTED NOT DETECTED Final   Neisseria meningitidis NOT DETECTED NOT DETECTED Final   Pseudomonas aeruginosa NOT DETECTED NOT DETECTED Final   Stenotrophomonas maltophilia NOT DETECTED NOT DETECTED Final   Candida albicans NOT DETECTED NOT DETECTED Final   Candida auris NOT DETECTED NOT DETECTED Final   Candida glabrata NOT DETECTED NOT DETECTED Final   Candida krusei NOT DETECTED NOT DETECTED Final   Candida parapsilosis NOT DETECTED NOT DETECTED Final   Candida tropicalis NOT DETECTED NOT DETECTED Final   Cryptococcus neoformans/gattii NOT DETECTED NOT DETECTED Final   CTX-M ESBL NOT DETECTED NOT DETECTED Final   Carbapenem resistance IMP NOT DETECTED NOT DETECTED Final   Carbapenem resistance KPC NOT DETECTED NOT DETECTED Final   Carbapenem resistance NDM NOT DETECTED NOT DETECTED Final   Carbapenem resist OXA 48 LIKE NOT DETECTED NOT DETECTED Final   Carbapenem resistance VIM NOT DETECTED NOT DETECTED Final    Comment: Performed at [facility], [address] [place], [place] [address]  Culture, blood (routine x 2)     Status: None (Preliminary result)   Collection Time: 12/27/23  2:17 AM   Specimen: BLOOD  Result Value Ref Range Status   Specimen Description BLOOD LEFT [facility]  Final   Special Requests   Final    BOTTLES DRAWN AEROBIC AND ANAEROBIC Blood Culture adequate volume   Culture   Final    NO GROWTH 1 DAY Performed  at [facility], [address] [place], [place] [address]    Report Status PENDING  Incomplete  Urine Culture     Status: Abnormal (Preliminary result)   Collection Time: 12/27/23  2:47 AM   Specimen: Urine, Random  Result Value Ref Range Status   Specimen Description   Final    URINE, RANDOM Performed at [facility],  [address], [place], [place] [address]    Special Requests   Final    NONE Reflexed from [id number] Performed at [facility], [address] [place], [place] [address]    Culture >=100,000 COLONIES/mL ESCHERICHIA COLI (A)  Final   Report Status PENDING  Incomplete  Resp panel by RT-PCR (RSV, Flu A&B, Covid) Anterior Nasal Swab     Status: None   Collection Time: 12/27/23  4:12 PM   Specimen: Anterior Nasal Swab  Result Value Ref Range Status   SARS Coronavirus 2 by RT PCR NEGATIVE NEGATIVE Final    Comment: (NOTE) SARS-CoV-2 target nucleic acids are NOT DETECTED.  The SARS-CoV-2 RNA is generally detectable in upper respiratory specimens during the acute phase of infection. The lowest concentration of SARS-CoV-2 viral copies this assay can detect is 138 copies/mL. A negative result does not preclude SARS-Cov-2 infection and should not be used as the sole basis for treatment or other patient management decisions. A negative result may occur with  improper specimen collection/handling, submission of specimen other than nasopharyngeal swab, presence of viral mutation(s) within the areas targeted by this assay, and inadequate number of viral copies(<138 copies/mL). A negative result must be combined with clinical observations, patient history, and epidemiological information. The expected result is Negative.  Fact Sheet for Patients:  BloggerCourse.com  Fact Sheet for Healthcare Providers:  SeriousBroker.it  This test is no t yet approved or cleared by the [place]  FDA and   has been authorized for detection and/or diagnosis of SARS-CoV-2 by FDA under an Emergency Use Authorization (EUA). This EUA will remain  in effect (meaning this test can be used) for the duration of the COVID-19 declaration under Section 564(b)(1) of the Act, 21 U.S.C.section 360bbb-3(b)(1), unless the authorization is terminated  or revoked sooner.       Influenza A by PCR NEGATIVE NEGATIVE Final   Influenza B by PCR NEGATIVE NEGATIVE Final    Comment: (NOTE) The Xpert Xpress SARS-CoV-2/FLU/RSV plus assay is intended as an aid in the diagnosis of influenza from Nasopharyngeal swab specimens and should not be used as a sole basis for treatment. Nasal washings and aspirates are unacceptable for Xpert Xpress SARS-CoV-2/FLU/RSV testing.  Fact Sheet for Patients: BloggerCourse.com  Fact Sheet for Healthcare Providers: SeriousBroker.it  This test is not yet approved or cleared by the [place]  FDA and has been authorized for detection and/or diagnosis of SARS-CoV-2 by FDA under an Emergency Use Authorization (EUA). This EUA will remain in effect (meaning this test can be used) for the duration of the COVID-19 declaration under Section 564(b)(1) of the Act, 21 U.S.C. section 360bbb-3(b)(1), unless the authorization is terminated or revoked.     Resp Syncytial Virus by PCR NEGATIVE NEGATIVE Final    Comment: (NOTE) Fact Sheet for Patients: BloggerCourse.com  Fact Sheet for Healthcare Providers: SeriousBroker.it  This test is not yet approved or cleared by the [place]  FDA and has been authorized for detection and/or diagnosis of SARS-CoV-2 by FDA under an Emergency Use Authorization (EUA). This EUA will remain in effect (meaning this test can be used) for the duration of the COVID-19 declaration under Section 564(b)(1) of the Act, 21 U.S.C. section 360bbb-3(b)(1),  unless the authorization is terminated or revoked.  Performed at [facility], [address] [place], [place] [address]      Radiology Studies: CT Head Wo Contrast Result Date: 12/27/2023 EXAM: CT HEAD AND CERVICAL SPINE 12/27/2023 03:09:25 AM TECHNIQUE: CT of the head and  cervical spine was performed without the administration of intravenous contrast. Multiplanar reformatted images are provided for review. Automated exposure control, iterative reconstruction, and/or weight based adjustment of the mA/kV was utilized to reduce the radiation dose to as low as reasonably achievable. COMPARISON: None available. CLINICAL HISTORY: Head trauma, minor (Age >= 65y). Patient fell due to dizziness, denying hitting head or pain. EMS reported emesis en route, patient is alert and oriented x3 but not to time. Family reports early onset dementia. Patient endorses burning while urinating. EMS reported Right Bundle Branch Block with possible atrial flutter on 12-lead ECG. Patient is on Xarelto  at home. FINDINGS: CT HEAD BRAIN AND VENTRICLES: No acute intracranial hemorrhage, mass effect, midline shift, or abnormal extra-axial fluid collection. No evidence of acute infarct. No hydrocephalus. Old left basal ganglia small vessel infarct and chronic ischemic white matter changes are present. ORBITS: No acute abnormality. SINUSES AND MASTOIDS: No acute abnormality. SOFT TISSUES AND SKULL: No acute skull fracture. No acute soft tissue abnormality. CT CERVICAL SPINE BONES AND ALIGNMENT: No acute fracture or traumatic malalignment. DEGENERATIVE CHANGES: No significant degenerative changes. SOFT TISSUES: No prevertebral soft tissue swelling. IMPRESSION: 1. No acute intracranial abnormality. 2. 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 3. No acute abnormality of the cervical spine. Electronically signed by: Franky Stanford MD 12/27/2023 03:20 AM EDT RP  Workstation: [id number]   CT Cervical Spine Wo Contrast Result Date: 12/27/2023 EXAM: CT HEAD AND CERVICAL SPINE 12/27/2023 03:09:25 AM TECHNIQUE: CT of the head and cervical spine was performed without the administration of intravenous contrast. Multiplanar reformatted images are provided for review. Automated exposure control, iterative reconstruction, and/or weight based adjustment of the mA/kV was utilized to reduce the radiation dose to as low as reasonably achievable. COMPARISON: None available. CLINICAL HISTORY: Head trauma, minor (Age >= 66y). Patient fell due to dizziness, denying hitting head or pain. EMS reported emesis en route, patient is alert and oriented x3 but not to time. Family reports early onset dementia. Patient endorses burning while urinating. EMS reported Right Bundle Branch Block with possible atrial flutter on 12-lead ECG. Patient is on Xarelto  at home. FINDINGS: CT HEAD BRAIN AND VENTRICLES: No acute intracranial hemorrhage, mass effect, midline shift, or abnormal extra-axial fluid collection. No evidence of acute infarct. No hydrocephalus. Old left basal ganglia small vessel infarct and chronic ischemic white matter changes are present. ORBITS: No acute abnormality. SINUSES AND MASTOIDS: No acute abnormality. SOFT TISSUES AND SKULL: No acute skull fracture. No acute soft tissue abnormality. CT CERVICAL SPINE BONES AND ALIGNMENT: No acute fracture or traumatic malalignment. DEGENERATIVE CHANGES: No significant degenerative changes. SOFT TISSUES: No prevertebral soft tissue swelling. IMPRESSION: 1. No acute intracranial abnormality. 2. 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 3. No acute abnormality of the cervical spine. Electronically signed by: Franky Stanford MD 12/27/2023 03:20 AM EDT RP Workstation: [id number]   DG Pelvis 1-2 Views Result Date: 12/27/2023 EXAM: 1 or 2 VIEW(S) XRAY OF THE PELVIS 12/27/2023 02:39:00 AM  COMPARISON: None available. CLINICAL HISTORY: fall on buttock. Pt fell on floor beside her bed. Reports she fell d/t dizziness. Denies hitting head. Denies pain. EMS reports pt family report pt has early onset dementia. Pt is on Xarelto  at home. FINDINGS: BONES AND JOINTS: Partially visualized left femoral intramedullary rod with interlocking screw. Old fracture deformity of the proximal left femur. Mild degenerative changes in bilateral hips. No acute fracture. No joint dislocation. SOFT TISSUES:  The soft tissues are unremarkable. IMPRESSION: 1. No acute fracture or dislocation. 2. Old fracture deformity of the proximal left femur with partially visualized left femoral intramedullary rod and interlocking screw. 3. Mild degenerative changes in bilateral hips. Electronically signed by: Franky Stanford MD 12/27/2023 03:08 AM EDT RP Workstation: [id number]   DG Chest Port 1 View Result Date: 12/27/2023 EXAM: 1 VIEW(S) XRAY OF THE CHEST 12/27/2023 02:39:00 AM COMPARISON: 03/01/2022. CLINICAL HISTORY: cp. Pt arrived via EMS from home. Pt fell on floor beside her bed. Reports she fell d/t dizziness. Denies hitting head. Denies pain. EMS reported episode of emesis on the way here. A/O x3, not oriented to time. EMS reports pt family report pt has early onset dementia. Pt endorses burning while urinating. EMS reports their 12-lead ECG showed Right Bundle Branch Block w/possible A flutter. Pt is on Xarelto  at home. FINDINGS: LUNGS AND PLEURA: Low lung volume. Hazy opacity overlying left lateral costophrenic angle, likely atelectasis. No pulmonary edema. No pleural effusion. No pneumothorax. HEART AND MEDIASTINUM: Aortic arch calcifications. No acute abnormality of the cardiac and mediastinal silhouettes. BONES AND SOFT TISSUES: Right axillary surgical clips noted. Bilateral breast implants noted. No acute osseous abnormality. IMPRESSION: 1. Low lung volumes with likely atelectasis at the left lateral costophrenic angle; no  acute cardiopulmonary process. 2. Aortic arch atherosclerotic calcifications. 3. Right axillary surgical clips and bilateral breast implants. Electronically signed by: Franky Stanford MD 12/27/2023 03:07 AM EDT RP Workstation: [id number]    Scheduled Meds:  amitriptyline   100 mg Oral QHS   aspirin   81 mg Oral Daily   buPROPion   150 mg Oral Daily   busPIRone   15 mg Oral Daily   carisoprodol   350 mg Oral QHS   dorzolamidel-timolol   1 drop Both Eyes BID   famotidine  20 mg Oral BID   fluticasone  furoate-vilanterol  1 puff Inhalation Daily   insulin  aspart  0-15 Units Subcutaneous TID WC   insulin  aspart  0-5 Units Subcutaneous QHS   irbesartan   300 mg Oral Daily   latanoprost   1 drop Both Eyes QHS   levothyroxine   50 mcg Oral Daily   rivaroxaban   20 mg Oral Q breakfast   rosuvastatin  5 mg Oral Daily   sertraline   150 mg Oral Daily   traZODone   150 mg Oral QHS   Continuous Infusions:  [START ON 12/29/2023] cefTRIAXone (ROCEPHIN)  IV     potassium PHOSPHATE IVPB (in mmol) 15 mmol (12/28/23 1112)     LOS: 0 days  MDM: Patient is high risk for one or more organ failure.  They necessitate ongoing hospitalization for continued IV therapies and subsequent lab monitoring. Total time spent interpreting labs and vitals, reviewing the medical record, coordinating care amongst consultants and care team members, directly assessing and discussing care with the patient and/or family: 55 min  Jaklyn Alen, DO [facility]  To contact the attending physician between 7A-7P please use Epic Chat. To contact the covering physician during after hours 7P-7A, please review Amion.  12/28/2023, 4:23 PM   *This document has been created with the assistance of dictation software. Please excuse typographical errors. *

## 2023-12-28 NOTE — Evaluation (Signed)
 Occupational Therapy Evaluation Patient Details Name: Melanie Hammond MRN: [id number] DOB: Aug 15, 1950 Today's Date: 12/28/2023   History of Present Illness   Pt is a 73 y.o. female admitted with sepsis secondary to UTI with fall 2/2 dizziness. PMH significant for DM, HTN, A-flutter, dementia     Clinical Impressions Pt admitted with above. Prior to admission, pt lives with family members who are available 24/7 to assist. She uses RW for household distances and a manual wheelchair for community level mobility. Pt with hx of dementia and requires cues for sequencing/environmental awareness throughout session. Pt requires supervision for STS transfers, is able to perform functional mobility ~25 ft in room with CGA + RW, and requires gown change + pericare/LB hygiene while seated EOB due to incontinent urine episode upon standing. Good dynamic seated balance using seated figure four to doff/don socks. Orthostatics assessed, pt not symptomatic and negative.   Pt would benefit from skilled OT services to address noted impairments and functional limitations (see below for any additional details) in order to maximize safety and independence while minimizing falls risk and caregiver burden. Anticipate the need for follow up [facility] OT services upon acute hospital DC.   Orthostatic VS for the past 24 hrs (Last 3 readings):  BP- Lying Pulse- Lying BP- Sitting Pulse- Sitting BP- Standing at 0 minutes Pulse- Standing at 0 minutes  12/28/23 1000 160/68 74 159/66 79 (!) 159/92 83        If plan is discharge home, recommend the following:   A little help with walking and/or transfers;A little help with bathing/dressing/bathroom;Direct supervision/assist for medications management;Direct supervision/assist for financial management;Assist for transportation;Supervision due to cognitive status;Help with stairs or ramp for entrance     Functional Status Assessment   Patient has had a recent decline in their  functional status and demonstrates the ability to make significant improvements in function in a reasonable and predictable amount of time.     Equipment Recommendations   BSC/3in1      Precautions/Restrictions   Precautions Precautions: Fall Restrictions Weight Bearing Restrictions Per Provider Order: No     Mobility Bed Mobility Overal bed mobility: Modified Independent                  Transfers Overall transfer level: Needs assistance Equipment used: Rolling walker (2 wheels) Transfers: Sit to/from Stand Sit to Stand: Supervision                  Balance Overall balance assessment: Needs assistance Sitting-balance support: Feet supported Sitting balance-Leahy Scale: Good Sitting balance - Comments: good dynamic balance sitting EOB to doff/don LB items   Standing balance support: Bilateral upper extremity supported, During functional activity, Reliant on assistive device for balance Standing balance-Leahy Scale: Good                             ADL either performed or assessed with clinical judgement   ADL Overall ADL's : Needs assistance/impaired             Lower Body Bathing: Cueing for sequencing;Supervison/ safety Lower Body Bathing Details (indicate cue type and reason): seated at EOB, able to perform figure four to reach down to both feet. cues for sequencing as pt incontinent of urine with socks saturated, unaware. mod vcs to perform adequate hygeine Upper Body Dressing : Contact guard assist;Sitting Upper Body Dressing Details (indicate cue type and reason): pt able to doff / don gown with CGA,  assist to manage tele leads Lower Body Dressing: Supervision/safety;Sit to/from stand;Sitting/lateral leans Lower Body Dressing Details (indicate cue type and reason): seated figure four to don/doff socks Toilet Transfer: Supervision/safety;Rolling walker (2 wheels);Ambulation;Contact guard assist Toilet Transfer Details (indicate  cue type and reason): no physical assist, CGA - supervision for safety Toileting- Clothing Manipulation and Hygiene: Supervision/safety;Sit to/from stand       Functional mobility during ADLs: Contact guard assist;Supervision/safety (functional mobility ~25 ft in room with CGA using RW) General ADL Comments: pt incontinent of urine upon first standing attempt, requires cues and CGA-supervision to doff / don gown, perform hygeine and doff/don socks seated at EOB     Vision Baseline Vision/History: 1 Wears glasses Ability to See in Adequate Light: 0 Adequate Patient Visual Report: No change from baseline              Pertinent Vitals/Pain Pain Assessment Pain Assessment: No/denies pain     Extremity/Trunk Assessment Upper Extremity Assessment Upper Extremity Assessment: Generalized weakness   Lower Extremity Assessment Lower Extremity Assessment: Generalized weakness   Cervical / Trunk Assessment Cervical / Trunk Assessment: Normal   Communication Communication Communication: No apparent difficulties   Cognition Arousal: Alert Behavior During Therapy: WFL for tasks assessed/performed Cognition: History of cognitive impairments             OT - Cognition Comments: hx of dementia. limited environmental awareness, requires cues to perform ADLs and to notice socks saturated in urine                 Following commands: Impaired Following commands impaired: Follows one step commands inconsistently, Follows one step commands with increased time     Cueing  General Comments   Cueing Techniques: Verbal cues;Gestural cues  VSS on RA, orthostatic vitals assessed (-) in flowsheet, pt not symptomatic           Home Living Family/patient expects to be discharged to:: Private residence Living Arrangements: Children Available Help at Discharge: Family;Available 24 hours/day Type of Home: House Home Access: Stairs to enter [employer] of Steps: 6    Home Layout: One level     Bathroom Shower/Tub: Chief Strategy Officer: Standard     Home Equipment: Agricultural consultant (2 wheels);Rollator (4 wheels);Wheelchair - manual;Shower seat          Prior Functioning/Environment Prior Level of Function : Needs assist       Physical Assist : Mobility (physical) Mobility (physical): Gait   Mobility Comments: rollator in home or wheelchair for community distances ADLs Comments: independent    OT Problem List: Decreased strength;Decreased range of motion;Decreased activity tolerance;Impaired balance (sitting and/or standing);Decreased cognition;Decreased knowledge of use of DME or AE;Decreased safety awareness;Decreased knowledge of precautions   OT Treatment/Interventions: Self-care/ADL training;Therapeutic exercise;Energy conservation;DME and/or AE instruction;Therapeutic activities;Cognitive remediation/compensation;Patient/family education;Balance training      OT Goals(Current goals can be found in the care plan section)   Acute Rehab OT Goals OT Goal Formulation: With patient Time For Goal Achievement: 01/11/24 Potential to Achieve Goals: Good   OT Frequency:  Min 2X/week    Co-evaluation PT/OT/SLP Co-Evaluation/Treatment: Yes Reason for Co-Treatment: For patient/therapist safety;Necessary to address cognition/behavior during functional activity;To address functional/ADL transfers PT goals addressed during session: Mobility/safety with mobility;Balance;Proper use of DME OT goals addressed during session: ADL's and self-care      AM-PAC OT 6 Clicks Daily Activity     Outcome Measure Help from another person eating meals?: None Help from another person taking care of  personal grooming?: A Little Help from another person toileting, which includes using toliet, bedpan, or urinal?: A Little Help from another person bathing (including washing, rinsing, drying)?: A Little Help from another person to put on and  taking off regular upper body clothing?: A Little Help from another person to put on and taking off regular lower body clothing?: A Little 6 Click Score: 19   End of Session Equipment Utilized During Treatment: Rolling walker (2 wheels) Nurse Communication: Mobility status  Activity Tolerance: Patient tolerated treatment well Patient left: in chair;with call bell/phone within reach;with nursing/sitter in room;with chair alarm set  OT Visit Diagnosis: Muscle weakness (generalized) (M62.81);Other abnormalities of gait and mobility (R26.89);Unsteadiness on feet (R26.81);History of falling (Z91.81)                Time: 1016-1040 OT Time Calculation (min): 24 min Charges:  OT General Charges $OT Visit: 1 Visit OT Evaluation $OT Eval Low Complexity: 1 Low  Marqueze Ramcharan L. Luis Nickles, OTR/L  12/28/23, 11:31 AM

## 2023-12-28 NOTE — Plan of Care (Addendum)
 Patient was alert, oriented to person, time and situation during the night. Patient c/o generalized pain- Norco 1x. Patient takes Cosopt  eyedrops- at night she take 1 drop both eyes but reports she takes in am 1 drop only to her left eye. Patient had elevated BP this am- received permission from oncall Hospitalist to administer daily Irbesartan  early. Patient assisted to [facility] 1x assist. Incontinent care administered d/t urgency. IV Abx infused without difficulty.Telemetry in place- rate controlled- 1st degree HB during the night- continued from day.  Safety measures in place throughout the night.  Problem: Coping: Goal: Ability to adjust to condition or change in health will improve Outcome: Progressing   Problem: Nutritional: Goal: Maintenance of adequate nutrition will improve Outcome: Progressing   Problem: Education: Goal: Knowledge of General Education information will improve Description: Including pain rating scale, medication(s)/side effects and non-pharmacologic comfort measures Outcome: Progressing

## 2023-12-29 DIAGNOSIS — W19XXXS Unspecified fall, sequela: Secondary | ICD-10-CM

## 2023-12-29 DIAGNOSIS — Z7901 Long term (current) use of anticoagulants: Secondary | ICD-10-CM

## 2023-12-29 DIAGNOSIS — I4892 Unspecified atrial flutter: Secondary | ICD-10-CM

## 2023-12-29 DIAGNOSIS — A419 Sepsis, unspecified organism: Secondary | ICD-10-CM

## 2023-12-29 DIAGNOSIS — R6520 Severe sepsis without septic shock: Secondary | ICD-10-CM

## 2023-12-29 DIAGNOSIS — D696 Thrombocytopenia, unspecified: Secondary | ICD-10-CM | POA: Insufficient documentation

## 2023-12-29 DIAGNOSIS — E876 Hypokalemia: Secondary | ICD-10-CM | POA: Insufficient documentation

## 2023-12-29 DIAGNOSIS — F0390 Unspecified dementia without behavioral disturbance: Secondary | ICD-10-CM | POA: Diagnosis not present

## 2023-12-29 DIAGNOSIS — E663 Overweight: Secondary | ICD-10-CM | POA: Insufficient documentation

## 2023-12-29 DIAGNOSIS — I10 Essential (primary) hypertension: Secondary | ICD-10-CM

## 2023-12-29 LAB — CBC WITH DIFFERENTIAL/PLATELET
Abs Immature Granulocytes: 0.02 K/uL (ref 0.00–0.07)
Basophils Absolute: 0 K/uL (ref 0.0–0.1)
Basophils Relative: 0 %
Eosinophils Absolute: 0 K/uL (ref 0.0–0.5)
Eosinophils Relative: 0 %
HCT: 31.1 % — ABNORMAL LOW (ref 36.0–46.0)
Hemoglobin: 10.5 g/dL — ABNORMAL LOW (ref 12.0–15.0)
Immature Granulocytes: 0 %
Lymphocytes Relative: 12 %
Lymphs Abs: 0.5 K/uL — ABNORMAL LOW (ref 0.7–4.0)
MCH: 28.8 pg (ref 26.0–34.0)
MCHC: 33.8 g/dL (ref 30.0–36.0)
MCV: 85.2 fL (ref 80.0–100.0)
Monocytes Absolute: 0.4 K/uL (ref 0.1–1.0)
Monocytes Relative: 9 %
Neutro Abs: 3.6 K/uL (ref 1.7–7.7)
Neutrophils Relative %: 79 %
Platelets: 110 K/uL — ABNORMAL LOW (ref 150–400)
RBC: 3.65 MIL/uL — ABNORMAL LOW (ref 3.87–5.11)
RDW: 14.9 % (ref 11.5–15.5)
WBC: 4.7 K/uL (ref 4.0–10.5)
nRBC: 0 % (ref 0.0–0.2)

## 2023-12-29 LAB — COMPREHENSIVE METABOLIC PANEL WITH GFR
ALT: 9 U/L (ref 0–44)
AST: 12 U/L — ABNORMAL LOW (ref 15–41)
Albumin: 2.9 g/dL — ABNORMAL LOW (ref 3.5–5.0)
Alkaline Phosphatase: 40 U/L (ref 38–126)
Anion gap: 7 (ref 5–15)
BUN: 5 mg/dL — ABNORMAL LOW (ref 8–23)
CO2: 25 mmol/L (ref 22–32)
Calcium: 8.9 mg/dL (ref 8.9–10.3)
Chloride: 103 mmol/L (ref 98–111)
Creatinine, Ser: 0.55 mg/dL (ref 0.44–1.00)
GFR, Estimated: >60 mL/min (ref >60)
Glucose, Bld: 134 mg/dL — ABNORMAL HIGH (ref 70–99)
Potassium: 3.1 mmol/L — ABNORMAL LOW (ref 3.5–5.1)
Sodium: 135 mmol/L (ref 135–145)
Total Bilirubin: 0.4 mg/dL (ref 0.0–1.2)
Total Protein: 6.4 g/dL — ABNORMAL LOW (ref 6.5–8.1)

## 2023-12-29 LAB — MAGNESIUM: Magnesium: 1.9 mg/dL (ref 1.7–2.4)

## 2023-12-29 LAB — URINE CULTURE: Culture: >=100000 — AB

## 2023-12-29 LAB — PHOSPHORUS: Phosphorus: 2.2 mg/dL — ABNORMAL LOW (ref 2.5–4.6)

## 2023-12-29 MED ORDER — CARVEDILOL 25 MG PO TABS
25.0000 mg | ORAL_TABLET | Freq: Two times a day (BID) | ORAL | Status: DC
  Administered 2023-12-29 – 2023-12-30 (×2): 25 mg via ORAL
  Filled 2023-12-29 (×2): qty 1

## 2023-12-29 MED ORDER — AMLODIPINE BESYLATE 5 MG PO TABS
2.5000 mg | ORAL_TABLET | Freq: Every day | ORAL | Status: DC
  Administered 2023-12-29: 2.5 mg via ORAL
  Filled 2023-12-29: qty 1

## 2023-12-29 NOTE — Assessment & Plan Note (Signed)
 BMI 26.15

## 2023-12-29 NOTE — Progress Notes (Signed)
   12/28/23 2100  Level of Consciousness  Level of Consciousness Alert  MEWS COLOR  MEWS Score Color Green  Pain Assessment  Pain Scale 0-10  Pain Score 0  PCA/Epidural/Spinal Assessment  Respiratory Pattern Dyspnea with exertion  Glasgow Coma Scale  Eye Opening 4  Best Verbal Response (NON-intubated) 4  Best Motor Response 6  Glasgow Coma Scale Score 14  MEWS Score  MEWS Temp 0  MEWS Systolic 0  MEWS Pulse 0  MEWS RR 0  MEWS LOC 0  MEWS Score 0  Provider Notification  Provider Name/Title Delayne Solian, MD  Date Provider Notified 12/28/23  Time Provider Notified 2040  Method of Notification Page (Secure Chat)  Notification Reason Other (Comment) (BP is 175/71 MAP 101 Pulse: 76. She has nothing scheduled for her BP and nothing PRN. it looks like she has been running high since being here and the only thing she is getting for her BP is Irbesartan  300 mg daily @ 1000. please advise.)  Provider response No new orders (defer to primary attending in the am to address whether they want to adjust daily meds. no prns for now with current bp)  Date of Provider Response 12/28/23  Time of Provider Response 2045

## 2023-12-29 NOTE — Assessment & Plan Note (Signed)
 Replace potassium

## 2023-12-29 NOTE — Assessment & Plan Note (Signed)
 Could be secondary to sepsis.  Platelet count up to 132.

## 2023-12-29 NOTE — Progress Notes (Signed)
 Progress Note   Patient: Melanie Hammond FMW:[id number] DOB: Nov 18, 1950 DOA: 12/27/2023     1 DOS: the patient was seen and examined on 12/29/2023   Brief hospital course: 73 y.o. female with medical history significant for DM, HTN, A-flutter on [facility], dementia being admitted with sepsis secondary to UTI.  She presented following a fall preceded by dizziness.  Had an episode of emesis on the way to the hospital.  Patient endorses burning on urination.  She has had no fever, abdominal pain or change in bowel habit. In the ED, febrile to 101, heart rate 135 with otherwise normal vitals. Labs notable for WBC of 13,000 with lactic acid 2.4 Urinalysis with positive nitrites large leuks and rare bacteria. CMP unremarkable An EKG showed sinus tachycardia at 133. Chest x-ray nonacute CT head and C-spine and x-ray pelvis nontraumatic Patient treated with sepsis fluids started on Rocephin Also given magnesium  repletion for low mag of 1.6. Admission requested.   10/7.  [name] and blood culture. 10/8.  [name] pansensitive out of urine culture.  Awaiting sensitivities of blood culture.  Patient feeling weak.  Assessment and Plan: * Severe sepsis (HCC) Patient with fever, tachycardia, leukocytosis and lactic acidosis of 2.4.  [name] out of 1 blood culture.  [name] in the urine culture pansensitive.  Continue Rocephin.  Await sensitivities for blood culture.  Dementia without behavioral disturbance (HCC) Delirium precautions Continue Elavil , Zoloft  and trazodone   Paroxysmal atrial flutter (HCC) Chronic anticoagulation Continue Xarelto , continue metoprolol  No evidence of injury from fall  Essential hypertension Restart Coreg, continue Norvasc  and Avapro   Diabetes mellitus (HCC) Sliding scale insulin .  Hemoglobin A1c is low at 5.7  Falls, sequela PT recommending home health  Overweight (BMI 25.0-29.9) BMI 26.15  Hypokalemia Replace potassium  Thrombocytopenia Could be secondary to  sepsis        Subjective: Patient feels okay.  Offers no complaints.  Physical Exam: Vitals:   12/29/23 0446 12/29/23 0750 12/29/23 0812 12/29/23 1000  BP: (!) 176/52 (!) 158/72 (!) 175/71 (!) 186/75  Pulse: 84 74  70  Resp: 16 16    Temp: 99.8 F (37.7 C) 98.3 F (36.8 C)    TempSrc:      SpO2: 97% 96%    Weight:  78 kg    Height:       Physical Exam HENT:     Head: Normocephalic.  Eyes:     General: Lids are normal.  Cardiovascular:     Rate and Rhythm: Normal rate and regular rhythm.     Heart sounds: Normal heart sounds, S1 normal and S2 normal.  Pulmonary:     Breath sounds: No decreased breath sounds, wheezing, rhonchi or rales.  Abdominal:     Palpations: Abdomen is soft.     Tenderness: There is no abdominal tenderness.  Musculoskeletal:     Right lower leg: No swelling.     Left lower leg: No swelling.  Skin:    General: Skin is warm.     Findings: No rash.  Neurological:     Mental Status: She is alert.     Data Reviewed: Hemoglobin A1c 5.7, potassium 3.1, creatinine 0.55, white blood count 4.7, platelet count 110, hemoglobin 10.5  Family Communication: Updated daughter on the phone  Disposition: Status is: Inpatient Remains inpatient appropriate because: Await sensitivities of blood cultures.  Potential disposition tomorrow  Planned Discharge Destination: Home with Home Health    Time spent: 28 minutes  Author: Charlie Patterson, MD  12/29/2023 4:49 PM  For on call review [email or web address].

## 2023-12-29 NOTE — Assessment & Plan Note (Signed)
 Patient with fever, tachycardia, leukocytosis and lactic acidosis of 2.4.  [name] out of 1 blood culture.  [name] in the urine culture pansensitive.  Continue Rocephin.  Await sensitivities for blood culture.

## 2023-12-29 NOTE — Hospital Course (Signed)
 73 y.o. female with medical history significant for DM, HTN, A-flutter on [facility], dementia being admitted with sepsis secondary to UTI.  She presented following a fall preceded by dizziness.  Had an episode of emesis on the way to the hospital.  Patient endorses burning on urination.  She has had no fever, abdominal pain or change in bowel habit. In the ED, febrile to 101, heart rate 135 with otherwise normal vitals. Labs notable for WBC of 13,000 with lactic acid 2.4 Urinalysis with positive nitrites large leuks and rare bacteria. CMP unremarkable An EKG showed sinus tachycardia at 133. Chest x-ray nonacute CT head and C-spine and x-ray pelvis nontraumatic Patient treated with sepsis fluids started on Rocephin Also given magnesium  repletion for low mag of 1.6. Admission requested.   10/7.  [name] and blood culture. 10/8.  [name] pansensitive out of urine culture.  Awaiting sensitivities of blood culture.  Patient feeling weak. 10/9.  Patient stable for discharge home.  Blood cultures pansensitive also.  Rocephin given in the morning of 10/9.  Switch over to high-dose Amoxil 1000 mg 3 times daily for 3 more days upon discharge.

## 2023-12-29 NOTE — Assessment & Plan Note (Signed)
PT recommending home health.

## 2023-12-30 DIAGNOSIS — A419 Sepsis, unspecified organism: Secondary | ICD-10-CM | POA: Diagnosis not present

## 2023-12-30 DIAGNOSIS — I10 Essential (primary) hypertension: Secondary | ICD-10-CM | POA: Diagnosis not present

## 2023-12-30 DIAGNOSIS — F0390 Unspecified dementia without behavioral disturbance: Secondary | ICD-10-CM | POA: Diagnosis not present

## 2023-12-30 DIAGNOSIS — I4892 Unspecified atrial flutter: Secondary | ICD-10-CM | POA: Diagnosis not present

## 2023-12-30 LAB — COMPREHENSIVE METABOLIC PANEL WITH GFR
ALT: 14 U/L (ref 0–44)
AST: 14 U/L — ABNORMAL LOW (ref 15–41)
Albumin: 2.9 g/dL — ABNORMAL LOW (ref 3.5–5.0)
Alkaline Phosphatase: 39 U/L (ref 38–126)
Anion gap: 8 (ref 5–15)
BUN: 8 mg/dL (ref 8–23)
CO2: 25 mmol/L (ref 22–32)
Calcium: 9 mg/dL (ref 8.9–10.3)
Chloride: 101 mmol/L (ref 98–111)
Creatinine, Ser: 0.79 mg/dL (ref 0.44–1.00)
GFR, Estimated: >60 mL/min (ref >60)
Glucose, Bld: 103 mg/dL — ABNORMAL HIGH (ref 70–99)
Potassium: 3.1 mmol/L — ABNORMAL LOW (ref 3.5–5.1)
Sodium: 134 mmol/L — ABNORMAL LOW (ref 135–145)
Total Bilirubin: 0.5 mg/dL (ref 0.0–1.2)
Total Protein: 6.7 g/dL (ref 6.5–8.1)

## 2023-12-30 LAB — CBC WITH DIFFERENTIAL/PLATELET
Abs Immature Granulocytes: 0.02 K/uL (ref 0.00–0.07)
Basophils Absolute: 0 K/uL (ref 0.0–0.1)
Basophils Relative: 1 %
Eosinophils Absolute: 0.1 K/uL (ref 0.0–0.5)
Eosinophils Relative: 3 %
HCT: 31.3 % — ABNORMAL LOW (ref 36.0–46.0)
Hemoglobin: 10.1 g/dL — ABNORMAL LOW (ref 12.0–15.0)
Immature Granulocytes: 1 %
Lymphocytes Relative: 21 %
Lymphs Abs: 0.8 K/uL (ref 0.7–4.0)
MCH: 27.7 pg (ref 26.0–34.0)
MCHC: 32.3 g/dL (ref 30.0–36.0)
MCV: 85.8 fL (ref 80.0–100.0)
Monocytes Absolute: 0.5 K/uL (ref 0.1–1.0)
Monocytes Relative: 12 %
Neutro Abs: 2.5 K/uL (ref 1.7–7.7)
Neutrophils Relative %: 62 %
Platelets: 132 K/uL — ABNORMAL LOW (ref 150–400)
RBC: 3.65 MIL/uL — ABNORMAL LOW (ref 3.87–5.11)
RDW: 14.9 % (ref 11.5–15.5)
WBC: 4 K/uL (ref 4.0–10.5)
nRBC: 0 % (ref 0.0–0.2)

## 2023-12-30 LAB — MAGNESIUM: Magnesium: 2.1 mg/dL (ref 1.7–2.4)

## 2023-12-30 LAB — CULTURE, BLOOD (ROUTINE X 2)
Culture: NO GROWTH — AB
Special Requests: ADEQUATE

## 2023-12-30 LAB — GLUCOSE, CAPILLARY: Glucose-Capillary: 102 mg/dL — ABNORMAL HIGH (ref 70–99)

## 2023-12-30 LAB — PHOSPHORUS: Phosphorus: 3 mg/dL (ref 2.5–4.6)

## 2023-12-30 MED ORDER — POTASSIUM CHLORIDE CRYS ER 20 MEQ PO TBCR
40.0000 meq | EXTENDED_RELEASE_TABLET | Freq: Once | ORAL | Status: AC
  Administered 2023-12-30: 40 meq via ORAL
  Filled 2023-12-30: qty 2

## 2023-12-30 MED ORDER — POTASSIUM CHLORIDE CRYS ER 20 MEQ PO TBCR: 20.0000 meq | EXTENDED_RELEASE_TABLET | Freq: Every day | ORAL | 0 refills | Status: AC

## 2023-12-30 MED ORDER — AMLODIPINE BESYLATE 5 MG PO TABS
5.0000 mg | ORAL_TABLET | Freq: Every day | ORAL | Status: DC
  Administered 2023-12-30: 5 mg via ORAL
  Filled 2023-12-30: qty 1

## 2023-12-30 MED ORDER — AMOXICILLIN 500 MG PO CAPS: 1000.0000 mg | ORAL_CAPSULE | Freq: Three times a day (TID) | ORAL | 0 refills | Status: AC

## 2023-12-30 NOTE — Discharge Summary (Signed)
 Physician Discharge Summary   Patient: Melanie Hammond MRN: [id number] DOB: 06/16/1950  Admit date:     12/27/2023  Discharge date: 12/30/23  Discharge Physician: Melanie Hammond   PCP: Melanie Myna Barrows, MD   Recommendations at discharge:   Follow-up PCP 5 days  Discharge Diagnoses: Principal Problem:   Severe sepsis (HCC) Active Problems:   Paroxysmal atrial flutter (HCC)   Dementia without behavioral disturbance (HCC)   Essential hypertension   Diabetes mellitus (HCC)   Falls, sequela   Chronic anticoagulation   Thrombocytopenia   Hypokalemia   Overweight (BMI 25.0-29.9)    Hospital Course: 73 y.o. female with medical history significant for DM, HTN, A-flutter on [facility], dementia being admitted with sepsis secondary to UTI.  She presented following a fall preceded by dizziness.  Had an episode of emesis on the way to the hospital.  Patient endorses burning on urination.  She has had no fever, abdominal pain or change in bowel habit. In the ED, febrile to 101, heart rate 135 with otherwise normal vitals. Labs notable for WBC of 13,000 with lactic acid 2.4 Urinalysis with positive nitrites large leuks and rare bacteria. CMP unremarkable An EKG showed sinus tachycardia at 133. Chest x-ray nonacute CT head and C-spine and x-ray pelvis nontraumatic Patient treated with sepsis fluids started on Rocephin Also given magnesium  repletion for low mag of 1.6. Admission requested.   10/7.  [name] and blood culture. 10/8.  [name] pansensitive out of urine culture.  Awaiting sensitivities of blood culture.  Patient feeling weak. 10/9.  Patient stable for discharge home.  Blood cultures pansensitive also.  Rocephin given in the morning of 10/9.  Switch over to high-dose Amoxil 1000 mg 3 times daily for 3 more days upon discharge.  Assessment and Plan: * Severe sepsis (HCC) Patient with fever, tachycardia, leukocytosis and lactic acidosis of 2.4.  [name] out of 1 blood culture and  pansensitive.  [name] in the urine culture pansensitive.  Continue Rocephin today and switch over to high-dose Amoxil for 3 more days upon discharge to complete course for sepsis.  Dementia without behavioral disturbance (HCC) Delirium precautions Continue Elavil , Zoloft  and trazodone   Paroxysmal atrial flutter (HCC) Chronic anticoagulation Continue Xarelto , continue metoprolol  No evidence of injury from fall  Essential hypertension Continue Coreg, Norvasc  and Avapro   Diabetes mellitus (HCC) Sliding scale insulin  while here.  Hemoglobin A1c is low at 5.7  Falls, sequela PT recommending home health  Overweight (BMI 25.0-29.9) BMI 26.15  Hypokalemia Replace potassium  Thrombocytopenia Could be secondary to sepsis.  Platelet count up to 132.         Consultants: None Procedures performed: None Disposition: Home Diet recommendation:  Regular diet DISCHARGE MEDICATION: Allergies as of 12/30/2023       Reactions   Compazine [prochlorperazine]    Thorazine [chlorpromazine]         Medication List     STOP taking these medications    hydrALAZINE  100 MG tablet Commonly known as: APRESOLINE    metFORMIN 500 MG tablet Commonly known as: GLUCOPHAGE   metoprolol  tartrate 25 MG tablet Commonly known as: LOPRESSOR    mirtazapine 7.5 MG tablet Commonly known as: REMERON       TAKE these medications    amitriptyline  100 MG tablet Commonly known as: ELAVIL  Take 100 mg by mouth at bedtime.   amLODipine  10 MG tablet Commonly known as: NORVASC  Take 1 tablet by mouth daily.   amoxicillin 500 MG capsule Commonly known as: AMOXIL Take  2 capsules (1,000 mg total) by mouth 3 (three) times daily for 3 days. Start taking on: December 31, 2023   aspirin  81 MG chewable tablet Chew 81 mg by mouth daily.   buPROPion  150 MG 24 hr tablet Commonly known as: WELLBUTRIN  XL Take 150 mg by mouth daily.   busPIRone  15 MG tablet Commonly known as: BUSPAR  Take 15 mg  by mouth in the morning and at bedtime.   carisoprodol  350 MG tablet Commonly known as: SOMA  Take 350 mg by mouth 3 (three) times daily.   carvedilol 25 MG tablet Commonly known as: COREG Take 25 mg by mouth 2 (two) times daily.   Cholecalciferol  25 MCG (1000 UT) capsule Take 1,000 Units by mouth daily.   dorzolamidel-timolol  22.3-6.8 MG/ML Soln ophthalmic solution Commonly known as: COSOPT  Place 1 drop into both eyes 2 (two) times daily.   famotidine 20 MG tablet Commonly known as: PEPCID Take 20 mg by mouth 2 (two) times daily.   fluticasone  furoate-vilanterol 100-25 MCG/INH Aepb Commonly known as: BREO ELLIPTA  Inhale 1 puff into the lungs daily.   irbesartan  300 MG tablet Commonly known as: AVAPRO  Take 300 mg by mouth daily.   latanoprost  0.005 % ophthalmic solution Commonly known as: XALATAN  Place 1 drop into both eyes at bedtime.   levothyroxine  50 MCG tablet Commonly known as: SYNTHROID  Take 50 mcg by mouth daily.   oxyCODONE -acetaminophen  5-325 MG tablet Commonly known as: PERCOCET/ROXICET Take 1-2 tablets by mouth 2 (two) times daily. Take one tablet in AM and two tablets at bedtime   potassium chloride SA 20 MEQ tablet Commonly known as: KLOR-CON M Take 1 tablet (20 mEq total) by mouth daily for 3 days.   PriLOSEC 20 MG capsule Generic drug: omeprazole Take 20 mg by mouth daily.   rivaroxaban  20 MG Tabs tablet Commonly known as: XARELTO  Take 20 mg by mouth daily with breakfast.   rosuvastatin 5 MG tablet Commonly known as: CRESTOR Take 5 mg by mouth daily.   sertraline  50 MG tablet Commonly known as: ZOLOFT  Take 150 mg by mouth daily.   traZODone  150 MG tablet Commonly known as: DESYREL  Take 150 mg by mouth at bedtime.        Follow-up Information     Melanie Myna Barrows, MD Follow up in 5 day(s).   Specialty: Internal Medicine Contact information: [address] [place] [address] [phone]                 Discharge Exam: Melanie Hammond   12/27/23 0208 12/29/23 0750  Weight: 79.7 kg 78 kg   Physical Exam HENT:     Head: Normocephalic.  Eyes:     General: Lids are normal.  Cardiovascular:     Rate and Rhythm: Normal rate and regular rhythm.     Heart sounds: Normal heart sounds, S1 normal and S2 normal.  Pulmonary:     Breath sounds: No decreased breath sounds, wheezing, rhonchi or rales.  Abdominal:     Palpations: Abdomen is soft.     Tenderness: There is no abdominal tenderness.  Musculoskeletal:     Right lower leg: No swelling.     Left lower leg: No swelling.  Skin:    General: Skin is warm.     Findings: No rash.  Neurological:     Mental Status: She is alert.      Condition at discharge: stable  The results of significant diagnostics from this hospitalization (including imaging, microbiology, ancillary and  laboratory) are listed below for reference.   Imaging Studies: CT Head Wo Contrast Result Date: 12/27/2023 EXAM: CT HEAD AND CERVICAL SPINE 12/27/2023 03:09:25 AM TECHNIQUE: CT of the head and cervical spine was performed without the administration of intravenous contrast. Multiplanar reformatted images are provided for review. Automated exposure control, iterative reconstruction, and/or weight based adjustment of the mA/kV was utilized to reduce the radiation dose to as low as reasonably achievable. COMPARISON: None available. CLINICAL HISTORY: Head trauma, minor (Age >= 65y). Patient fell due to dizziness, denying hitting head or pain. EMS reported emesis en route, patient is alert and oriented x3 but not to time. Family reports early onset dementia. Patient endorses burning while urinating. EMS reported Right Bundle Branch Block with possible atrial flutter on 12-lead ECG. Patient is on Xarelto  at home. FINDINGS: CT HEAD BRAIN AND VENTRICLES: No acute intracranial hemorrhage, mass effect, midline shift, or abnormal extra-axial fluid collection. No evidence of acute  infarct. No hydrocephalus. Old left basal ganglia small vessel infarct and chronic ischemic white matter changes are present. ORBITS: No acute abnormality. SINUSES AND MASTOIDS: No acute abnormality. SOFT TISSUES AND SKULL: No acute skull fracture. No acute soft tissue abnormality. CT CERVICAL SPINE BONES AND ALIGNMENT: No acute fracture or traumatic malalignment. DEGENERATIVE CHANGES: No significant degenerative changes. SOFT TISSUES: No prevertebral soft tissue swelling. IMPRESSION: 1. No acute intracranial abnormality. 2. 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 3. No acute abnormality of the cervical spine. Electronically signed by: Franky Stanford MD 12/27/2023 03:20 AM EDT RP Workstation: [id number]   CT Cervical Spine Wo Contrast Result Date: 12/27/2023 EXAM: CT HEAD AND CERVICAL SPINE 12/27/2023 03:09:25 AM TECHNIQUE: CT of the head and cervical spine was performed without the administration of intravenous contrast. Multiplanar reformatted images are provided for review. Automated exposure control, iterative reconstruction, and/or weight based adjustment of the mA/kV was utilized to reduce the radiation dose to as low as reasonably achievable. COMPARISON: None available. CLINICAL HISTORY: Head trauma, minor (Age >= 65y). Patient fell due to dizziness, denying hitting head or pain. EMS reported emesis en route, patient is alert and oriented x3 but not to time. Family reports early onset dementia. Patient endorses burning while urinating. EMS reported Right Bundle Branch Block with possible atrial flutter on 12-lead ECG. Patient is on Xarelto  at home. FINDINGS: CT HEAD BRAIN AND VENTRICLES: No acute intracranial hemorrhage, mass effect, midline shift, or abnormal extra-axial fluid collection. No evidence of acute infarct. No hydrocephalus. Old left basal ganglia small vessel infarct and chronic ischemic white matter changes are present. ORBITS: No  acute abnormality. SINUSES AND MASTOIDS: No acute abnormality. SOFT TISSUES AND SKULL: No acute skull fracture. No acute soft tissue abnormality. CT CERVICAL SPINE BONES AND ALIGNMENT: No acute fracture or traumatic malalignment. DEGENERATIVE CHANGES: No significant degenerative changes. SOFT TISSUES: No prevertebral soft tissue swelling. IMPRESSION: 1. No acute intracranial abnormality. 2. 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 3. No acute abnormality of the cervical spine. Electronically signed by: Franky Stanford MD 12/27/2023 03:20 AM EDT RP Workstation: [id number]   DG Pelvis 1-2 Views Result Date: 12/27/2023 EXAM: 1 or 2 VIEW(S) XRAY OF THE PELVIS 12/27/2023 02:39:00 AM COMPARISON: None available. CLINICAL HISTORY: fall on buttock. Pt fell on floor beside her bed. Reports she fell d/t dizziness. Denies hitting head. Denies pain. EMS reports pt family report pt has early onset dementia. Pt is on Xarelto  at home. FINDINGS: BONES AND  JOINTS: Partially visualized left femoral intramedullary rod with interlocking screw. Old fracture deformity of the proximal left femur. Mild degenerative changes in bilateral hips. No acute fracture. No joint dislocation. SOFT TISSUES: The soft tissues are unremarkable. IMPRESSION: 1. No acute fracture or dislocation. 2. Old fracture deformity of the proximal left femur with partially visualized left femoral intramedullary rod and interlocking screw. 3. Mild degenerative changes in bilateral hips. Electronically signed by: Franky Stanford MD 12/27/2023 03:08 AM EDT RP Workstation: [id number]   DG Chest Port 1 View Result Date: 12/27/2023 EXAM: 1 VIEW(S) XRAY OF THE CHEST 12/27/2023 02:39:00 AM COMPARISON: 03/01/2022. CLINICAL HISTORY: cp. Pt arrived via EMS from home. Pt fell on floor beside her bed. Reports she fell d/t dizziness. Denies hitting head. Denies pain. EMS reported episode of emesis on the way here. A/O  x3, not oriented to time. EMS reports pt family report pt has early onset dementia. Pt endorses burning while urinating. EMS reports their 12-lead ECG showed Right Bundle Branch Block w/possible A flutter. Pt is on Xarelto  at home. FINDINGS: LUNGS AND PLEURA: Low lung volume. Hazy opacity overlying left lateral costophrenic angle, likely atelectasis. No pulmonary edema. No pleural effusion. No pneumothorax. HEART AND MEDIASTINUM: Aortic arch calcifications. No acute abnormality of the cardiac and mediastinal silhouettes. BONES AND SOFT TISSUES: Right axillary surgical clips noted. Bilateral breast implants noted. No acute osseous abnormality. IMPRESSION: 1. Low lung volumes with likely atelectasis at the left lateral costophrenic angle; no acute cardiopulmonary process. 2. Aortic arch atherosclerotic calcifications. 3. Right axillary surgical clips and bilateral breast implants. Electronically signed by: Franky Stanford MD 12/27/2023 03:07 AM EDT RP Workstation: [id number]    Microbiology: Results for orders placed or performed during the hospital encounter of 12/27/23  Culture, blood (routine x 2)     Status: Abnormal   Collection Time: 12/27/23  2:10 AM   Specimen: BLOOD  Result Value Ref Range Status   Specimen Description   Final    BLOOD LEFT HAND Performed at [facility], [address], [place], [place] [address]    Special Requests   Final    BOTTLES DRAWN AEROBIC AND ANAEROBIC Blood Culture adequate volume Performed at [facility], [address] [place], [place] [address]    Culture  Setup Time   Final    GRAM NEGATIVE RODS AEROBIC BOTTLE ONLY CRITICAL RESULT CALLED TO, READ BACK BY AND VERIFIED WITH: tiffany gilchrist pharm.d 12/27/23 1712 kg Performed at [facility], [address], [place], [place] [address]    Culture ESCHERICHIA COLI (A)  Final   Report Status 12/30/2023 FINAL  Final   Organism ID, Bacteria ESCHERICHIA COLI  Final       Susceptibility   Escherichia coli - MIC*    AMPICILLIN <=2 SENSITIVE Sensitive     CEFAZOLIN  (NON-URINE) <=1 SENSITIVE Sensitive     CEFEPIME <=0.12 SENSITIVE Sensitive     ERTAPENEM <=0.12 SENSITIVE Sensitive     CEFTRIAXONE <=0.25 SENSITIVE Sensitive     CIPROFLOXACIN <=0.06 SENSITIVE Sensitive     GENTAMICIN <=1 SENSITIVE Sensitive     MEROPENEM <=0.25 SENSITIVE Sensitive     TRIMETH/SULFA <=20 SENSITIVE Sensitive     AMPICILLIN/SULBACTAM <=2 SENSITIVE Sensitive     PIP/TAZO Value in next row Sensitive      <=4 SENSITIVEThis is a modified FDA-approved test that has been validated and its performance characteristics determined by the reporting laboratory.  This laboratory is certified under the Clinical Laboratory Improvement Amendments CLIA as  qualified to perform high complexity clinical laboratory testing.    * ESCHERICHIA COLI  Blood Culture ID Panel (Reflexed)     Status: Abnormal   Collection Time: 12/27/23  2:10 AM  Result Value Ref Range Status   Enterococcus faecalis NOT DETECTED NOT DETECTED Final   Enterococcus Faecium NOT DETECTED NOT DETECTED Final   Listeria monocytogenes NOT DETECTED NOT DETECTED Final   Staphylococcus species NOT DETECTED NOT DETECTED Final   Staphylococcus aureus (BCID) NOT DETECTED NOT DETECTED Final   Staphylococcus epidermidis NOT DETECTED NOT DETECTED Final   Staphylococcus lugdunensis NOT DETECTED NOT DETECTED Final   Streptococcus species NOT DETECTED NOT DETECTED Final   Streptococcus agalactiae NOT DETECTED NOT DETECTED Final   Streptococcus pneumoniae NOT DETECTED NOT DETECTED Final   Streptococcus pyogenes NOT DETECTED NOT DETECTED Final   A.calcoaceticus-baumannii NOT DETECTED NOT DETECTED Final   Bacteroides fragilis NOT DETECTED NOT DETECTED Final   Enterobacterales DETECTED (A) NOT DETECTED Final    Comment: Enterobacterales represent a large order of gram negative bacteria, not a single organism. CRITICAL RESULT CALLED TO, READ BACK  BY AND VERIFIED WITH: Tiffany Gilchrist PHARM.D 12/27/23 1712    Enterobacter cloacae complex NOT DETECTED NOT DETECTED Final   Escherichia coli DETECTED (A) NOT DETECTED Final    Comment: CRITICAL RESULT CALLED TO, READ BACK BY AND VERIFIED WITH: Tiffany Gilchrist PHARM.D 12/27/23 1712    Klebsiella aerogenes NOT DETECTED NOT DETECTED Final   Klebsiella oxytoca NOT DETECTED NOT DETECTED Final   Klebsiella pneumoniae NOT DETECTED NOT DETECTED Final   Proteus species NOT DETECTED NOT DETECTED Final   Salmonella species NOT DETECTED NOT DETECTED Final   Serratia marcescens NOT DETECTED NOT DETECTED Final   Haemophilus influenzae NOT DETECTED NOT DETECTED Final   Neisseria meningitidis NOT DETECTED NOT DETECTED Final   Pseudomonas aeruginosa NOT DETECTED NOT DETECTED Final   Stenotrophomonas maltophilia NOT DETECTED NOT DETECTED Final   Candida albicans NOT DETECTED NOT DETECTED Final   Candida auris NOT DETECTED NOT DETECTED Final   Candida glabrata NOT DETECTED NOT DETECTED Final   Candida krusei NOT DETECTED NOT DETECTED Final   Candida parapsilosis NOT DETECTED NOT DETECTED Final   Candida tropicalis NOT DETECTED NOT DETECTED Final   Cryptococcus neoformans/gattii NOT DETECTED NOT DETECTED Final   CTX-M ESBL NOT DETECTED NOT DETECTED Final   Carbapenem resistance IMP NOT DETECTED NOT DETECTED Final   Carbapenem resistance KPC NOT DETECTED NOT DETECTED Final   Carbapenem resistance NDM NOT DETECTED NOT DETECTED Final   Carbapenem resist OXA 48 LIKE NOT DETECTED NOT DETECTED Final   Carbapenem resistance VIM NOT DETECTED NOT DETECTED Final    Comment: Performed at [facility], [address] [place], [place] [address]  Culture, blood (routine x 2)     Status: None (Preliminary result)   Collection Time: 12/27/23  2:17 AM   Specimen: BLOOD  Result Value Ref Range Status   Specimen Description BLOOD LEFT [facility]  Final   Special Requests   Final    BOTTLES DRAWN AEROBIC AND  ANAEROBIC Blood Culture adequate volume   Culture   Final    NO GROWTH 3 DAYS Performed at [facility], [address] [place], [place] [address]    Report Status PENDING  Incomplete  Urine Culture     Status: Abnormal   Collection Time: 12/27/23  2:47 AM   Specimen: Urine, Random  Result Value Ref Range Status   Specimen Description   Final  URINE, RANDOM Performed at [facility], [address] [place], [place] [address]    Special Requests   Final    NONE Reflexed from [id number] Performed at [facility], [address] [place], [place] [address]    Culture >=100,000 COLONIES/mL ESCHERICHIA COLI (A)  Final   Report Status 12/29/2023 FINAL  Final   Organism ID, Bacteria ESCHERICHIA COLI (A)  Final      Susceptibility   Escherichia coli - MIC*    AMPICILLIN <=2 SENSITIVE Sensitive     CEFAZOLIN  (URINE) Value in next row Sensitive      <=1 SENSITIVEThis is a modified FDA-approved test that has been validated and its performance characteristics determined by the reporting laboratory.  This laboratory is certified under the Clinical Laboratory Improvement Amendments CLIA as qualified to perform high complexity clinical laboratory testing.    CEFEPIME Value in next row Sensitive      <=1 SENSITIVEThis is a modified FDA-approved test that has been validated and its performance characteristics determined by the reporting laboratory.  This laboratory is certified under the Clinical Laboratory Improvement Amendments CLIA as qualified to perform high complexity clinical laboratory testing.    ERTAPENEM Value in next row Sensitive      <=1 SENSITIVEThis is a modified FDA-approved test that has been validated and its performance characteristics determined by the reporting laboratory.  This laboratory is certified under the Clinical Laboratory Improvement Amendments CLIA as qualified to perform high complexity clinical laboratory testing.    CEFTRIAXONE Value in  next row Sensitive      <=1 SENSITIVEThis is a modified FDA-approved test that has been validated and its performance characteristics determined by the reporting laboratory.  This laboratory is certified under the Clinical Laboratory Improvement Amendments CLIA as qualified to perform high complexity clinical laboratory testing.    CIPROFLOXACIN Value in next row Sensitive      <=1 SENSITIVEThis is a modified FDA-approved test that has been validated and its performance characteristics determined by the reporting laboratory.  This laboratory is certified under the Clinical Laboratory Improvement Amendments CLIA as qualified to perform high complexity clinical laboratory testing.    GENTAMICIN Value in next row Sensitive      <=1 SENSITIVEThis is a modified FDA-approved test that has been validated and its performance characteristics determined by the reporting laboratory.  This laboratory is certified under the Clinical Laboratory Improvement Amendments CLIA as qualified to perform high complexity clinical laboratory testing.    NITROFURANTOIN Value in next row Sensitive      <=1 SENSITIVEThis is a modified FDA-approved test that has been validated and its performance characteristics determined by the reporting laboratory.  This laboratory is certified under the Clinical Laboratory Improvement Amendments CLIA as qualified to perform high complexity clinical laboratory testing.    TRIMETH/SULFA Value in next row Sensitive      <=1 SENSITIVEThis is a modified FDA-approved test that has been validated and its performance characteristics determined by the reporting laboratory.  This laboratory is certified under the Clinical Laboratory Improvement Amendments CLIA as qualified to perform high complexity clinical laboratory testing.    AMPICILLIN/SULBACTAM Value in next row Sensitive      <=1 SENSITIVEThis is a modified FDA-approved test that has been validated and its performance characteristics determined by  the reporting laboratory.  This laboratory is certified under the Clinical Laboratory Improvement Amendments CLIA as qualified to perform high complexity clinical laboratory testing.    PIP/TAZO Value in next row Sensitive      <=  4 SENSITIVEThis is a modified FDA-approved test that has been validated and its performance characteristics determined by the reporting laboratory.  This laboratory is certified under the Clinical Laboratory Improvement Amendments CLIA as qualified to perform high complexity clinical laboratory testing.    MEROPENEM Value in next row Sensitive      <=4 SENSITIVEThis is a modified FDA-approved test that has been validated and its performance characteristics determined by the reporting laboratory.  This laboratory is certified under the Clinical Laboratory Improvement Amendments CLIA as qualified to perform high complexity clinical laboratory testing.    * >=100,000 COLONIES/mL ESCHERICHIA COLI  Resp panel by RT-PCR (RSV, Flu A&B, Covid) Anterior Nasal Swab     Status: None   Collection Time: 12/27/23  4:12 PM   Specimen: Anterior Nasal Swab  Result Value Ref Range Status   SARS Coronavirus 2 by RT PCR NEGATIVE NEGATIVE Final    Comment: (NOTE) SARS-CoV-2 target nucleic acids are NOT DETECTED.  The SARS-CoV-2 RNA is generally detectable in upper respiratory specimens during the acute phase of infection. The lowest concentration of SARS-CoV-2 viral copies this assay can detect is 138 copies/mL. A negative result does not preclude SARS-Cov-2 infection and should not be used as the sole basis for treatment or other patient management decisions. A negative result may occur with  improper specimen collection/handling, submission of specimen other than nasopharyngeal swab, presence of viral mutation(s) within the areas targeted by this assay, and inadequate number of viral copies(<138 copies/mL). A negative result must be combined with clinical observations, patient history,  and epidemiological information. The expected result is Negative.  Fact Sheet for Patients:  BloggerCourse.com  Fact Sheet for Healthcare Providers:  SeriousBroker.it  This test is no t yet approved or cleared by the [place]  FDA and  has been authorized for detection and/or diagnosis of SARS-CoV-2 by FDA under an Emergency Use Authorization (EUA). This EUA will remain  in effect (meaning this test can be used) for the duration of the COVID-19 declaration under Section 564(b)(1) of the Act, 21 U.S.C.section 360bbb-3(b)(1), unless the authorization is terminated  or revoked sooner.       Influenza A by PCR NEGATIVE NEGATIVE Final   Influenza B by PCR NEGATIVE NEGATIVE Final    Comment: (NOTE) The Xpert Xpress SARS-CoV-2/FLU/RSV plus assay is intended as an aid in the diagnosis of influenza from Nasopharyngeal swab specimens and should not be used as a sole basis for treatment. Nasal washings and aspirates are unacceptable for Xpert Xpress SARS-CoV-2/FLU/RSV testing.  Fact Sheet for Patients: BloggerCourse.com  Fact Sheet for Healthcare Providers: SeriousBroker.it  This test is not yet approved or cleared by the [place]  FDA and has been authorized for detection and/or diagnosis of SARS-CoV-2 by FDA under an Emergency Use Authorization (EUA). This EUA will remain in effect (meaning this test can be used) for the duration of the COVID-19 declaration under Section 564(b)(1) of the Act, 21 U.S.C. section 360bbb-3(b)(1), unless the authorization is terminated or revoked.     Resp Syncytial Virus by PCR NEGATIVE NEGATIVE Final    Comment: (NOTE) Fact Sheet for Patients: BloggerCourse.com  Fact Sheet for Healthcare Providers: SeriousBroker.it  This test is not yet approved or cleared by the [place]  FDA and has  been authorized for detection and/or diagnosis of SARS-CoV-2 by FDA under an Emergency Use Authorization (EUA). This EUA will remain in effect (meaning this test can be used) for the duration of the COVID-19 declaration under Section 564(b)(1) of the Act, 21  U.S.C. section 360bbb-3(b)(1), unless the authorization is terminated or revoked.  Performed at [facility], [address] [place], [place] [address]     Labs: CBC: Recent Labs  Lab 12/27/23 0217 12/28/23 0533 12/29/23 0511 12/30/23 0409  WBC 13.7* 5.7 4.7 4.0  NEUTROABS 12.2* 4.5 3.6 2.5  HGB 13.5 9.7* 10.5* 10.1*  HCT 39.2 29.5* 31.1* 31.3*  MCV 85.6 87.5 85.2 85.8  PLT 190 109* 110* 132*   Basic Metabolic Panel: Recent Labs  Lab 12/27/23 0217 12/28/23 0533 12/29/23 0511 12/29/23 0720 12/30/23 0409  NA 134* 136  --  135 134*  K 3.6 3.4*  --  3.1* 3.1*  CL 99 102  --  103 101  CO2 22 23  --  25 25  GLUCOSE 180* 131*  --  134* 103*  BUN 8 9  --  5* 8  CREATININE 0.71 0.73  --  0.55 0.79  CALCIUM 9.3 8.7*  --  8.9 9.0  MG 1.6* 2.0 1.9  --  2.1  PHOS  --  2.0*  --  2.2* 3.0   Liver Function Tests: Recent Labs  Lab 12/27/23 0217 12/28/23 0533 12/29/23 0720 12/30/23 0409  AST 18 10* 12* 14*  ALT 13 9 9 14   ALKPHOS 44 30* 40 39  BILITOT 0.6 0.3 0.4 0.5  PROT 7.3 5.9* 6.4* 6.7  ALBUMIN 3.7 2.8* 2.9* 2.9*   CBG: Recent Labs  Lab 12/27/23 2120 12/28/23 0759 12/28/23 1149 12/28/23 1637 12/30/23 0433  GLUCAP 133* 121* 125* 126* 102*    Discharge time spent: greater than 30 minutes.  Signed: Charlie Patterson, MD [facility] 12/30/2023

## 2023-12-30 NOTE — Care Management Important Message (Signed)
 Important Message  Patient Details  Name: Melanie Hammond MRN: [id number] Date of Birth: Jul 15, 1950   Important Message Given:  Yes - Medicare IM     Kye Hedden W, CMA 12/30/2023, 11:43 AM

## 2023-12-30 NOTE — Plan of Care (Signed)

## 2024-01-01 LAB — CULTURE, BLOOD (ROUTINE X 2)
Culture: NO GROWTH
Special Requests: ADEQUATE
